# Patient Record
Sex: Female | Born: 1960 | Race: Black or African American | Hispanic: No | Marital: Single | Smoking: Never smoker
Health system: Southern US, Community
[De-identification: ages and names within clinical notes are randomized; demographics above are authoritative.]

## PROBLEM LIST (undated history)

## (undated) DIAGNOSIS — E079 Disorder of thyroid, unspecified: Secondary | ICD-10-CM

## (undated) HISTORY — PX: COLONOSCOPY: SHX174

## (undated) HISTORY — PX: OTHER SURGICAL HISTORY: SHX169

## (undated) HISTORY — PX: PITUITARY SURGERY: SHX203

## (undated) HISTORY — DX: Disorder of thyroid, unspecified: E07.9

---

## 2006-11-14 ENCOUNTER — Ambulatory Visit: Payer: Self-pay

## 2007-09-14 ENCOUNTER — Emergency Department: Payer: Self-pay | Admitting: Emergency Medicine

## 2009-02-03 ENCOUNTER — Ambulatory Visit: Payer: Self-pay | Admitting: Nurse Practitioner

## 2009-02-17 ENCOUNTER — Ambulatory Visit: Payer: Self-pay | Admitting: Family Medicine

## 2009-04-09 ENCOUNTER — Encounter (INDEPENDENT_AMBULATORY_CARE_PROVIDER_SITE_OTHER): Payer: Self-pay | Admitting: Neurosurgery

## 2009-04-09 ENCOUNTER — Inpatient Hospital Stay (HOSPITAL_COMMUNITY): Admission: RE | Admit: 2009-04-09 | Discharge: 2009-04-13 | Payer: Self-pay | Admitting: Neurosurgery

## 2009-09-11 ENCOUNTER — Ambulatory Visit: Payer: Self-pay | Admitting: Neurosurgery

## 2010-02-16 ENCOUNTER — Ambulatory Visit: Payer: Self-pay | Admitting: Nurse Practitioner

## 2010-08-26 LAB — SODIUM: Sodium: 143 mEq/L (ref 135–145)

## 2010-08-26 LAB — URINALYSIS, ROUTINE W REFLEX MICROSCOPIC
Bilirubin Urine: NEGATIVE
Hgb urine dipstick: NEGATIVE
Ketones, ur: NEGATIVE mg/dL
Protein, ur: NEGATIVE mg/dL
Urobilinogen, UA: 1 mg/dL (ref 0.0–1.0)

## 2010-08-26 LAB — TYPE AND SCREEN: Antibody Screen: NEGATIVE

## 2010-08-26 LAB — POTASSIUM
Potassium: 3.4 mEq/L — ABNORMAL LOW (ref 3.5–5.1)
Potassium: 3.8 mEq/L (ref 3.5–5.1)

## 2010-08-26 LAB — CBC
Hemoglobin: 12.9 g/dL (ref 12.0–15.0)
MCHC: 33.5 g/dL (ref 30.0–36.0)
MCV: 82.8 fL (ref 78.0–100.0)
RDW: 13.1 % (ref 11.5–15.5)

## 2010-11-13 ENCOUNTER — Ambulatory Visit: Payer: Self-pay | Admitting: Neurosurgery

## 2011-05-17 ENCOUNTER — Ambulatory Visit: Payer: Self-pay

## 2011-11-02 ENCOUNTER — Ambulatory Visit: Payer: Self-pay | Admitting: Family Medicine

## 2011-12-20 ENCOUNTER — Ambulatory Visit: Payer: Self-pay | Admitting: Neurosurgery

## 2012-06-21 ENCOUNTER — Ambulatory Visit: Payer: Self-pay

## 2012-06-23 LAB — BETA STREP CULTURE(ARMC)

## 2012-12-07 ENCOUNTER — Ambulatory Visit: Payer: Self-pay | Admitting: Family Medicine

## 2012-12-27 ENCOUNTER — Ambulatory Visit: Payer: Self-pay | Admitting: Family Medicine

## 2013-01-22 ENCOUNTER — Ambulatory Visit: Payer: Self-pay | Admitting: Family Medicine

## 2013-09-03 ENCOUNTER — Ambulatory Visit: Payer: Self-pay | Admitting: Physician Assistant

## 2013-09-03 LAB — URINALYSIS, COMPLETE
Bilirubin,UR: NEGATIVE
Glucose,UR: NEGATIVE mg/dL (ref 0–75)
KETONE: NEGATIVE
Leukocyte Esterase: NEGATIVE
NITRITE: NEGATIVE
Ph: 7 (ref 4.5–8.0)
Specific Gravity: 1.02 (ref 1.003–1.030)
WBC UR: NONE SEEN /HPF (ref 0–5)

## 2013-09-03 LAB — PREGNANCY, URINE: Pregnancy Test, Urine: NEGATIVE m[IU]/mL

## 2014-04-10 ENCOUNTER — Ambulatory Visit: Payer: Self-pay | Admitting: Family Medicine

## 2015-05-22 DIAGNOSIS — E559 Vitamin D deficiency, unspecified: Secondary | ICD-10-CM | POA: Insufficient documentation

## 2015-05-22 DIAGNOSIS — M722 Plantar fascial fibromatosis: Secondary | ICD-10-CM | POA: Insufficient documentation

## 2015-05-22 LAB — HM HIV SCREENING LAB: HM HIV Screening: NEGATIVE

## 2015-05-22 LAB — HM HEPATITIS C SCREENING LAB: HM Hepatitis Screen: NEGATIVE

## 2015-05-27 ENCOUNTER — Other Ambulatory Visit: Payer: Self-pay | Admitting: Family Medicine

## 2015-05-27 DIAGNOSIS — Z1231 Encounter for screening mammogram for malignant neoplasm of breast: Secondary | ICD-10-CM

## 2015-06-23 LAB — HM PAP SMEAR: HM Pap smear: NEGATIVE

## 2015-06-24 ENCOUNTER — Ambulatory Visit
Admission: RE | Admit: 2015-06-24 | Discharge: 2015-06-24 | Disposition: A | Discharge status: Home / Self Care | Payer: Managed Care, Other (non HMO) | Source: Ambulatory Visit | Attending: Family Medicine | Admitting: Family Medicine

## 2015-06-24 DIAGNOSIS — Z1231 Encounter for screening mammogram for malignant neoplasm of breast: Secondary | ICD-10-CM | POA: Diagnosis present

## 2016-02-03 ENCOUNTER — Ambulatory Visit
Admission: EM | Admit: 2016-02-03 | Discharge: 2016-02-03 | Disposition: A | Discharge status: Home / Self Care | Payer: Managed Care, Other (non HMO) | Attending: Family Medicine | Admitting: Family Medicine

## 2016-02-03 DIAGNOSIS — H6121 Impacted cerumen, right ear: Secondary | ICD-10-CM | POA: Diagnosis not present

## 2016-02-03 NOTE — ED Triage Notes (Signed)
Patient states that she was taking swimming lessons yesterday and she got water in her right ear that will not drain out and she has multiple things to try to release the water without success.

## 2016-02-03 NOTE — ED Provider Notes (Signed)
CSN: [id number]     Arrival date & time 02/03/16  1944 History   First MD Initiated Contact with Patient 02/03/16 2009     Chief Complaint  Patient presents with  . Ear Fullness   (Consider location/radiation/quality/duration/timing/severity/associated sxs/prior Treatment) HPI  This 55 year old female who presents with right ear fullness. She states that she started taking swimming lessons at the [facility] in [place] and after getting out of the water yesterday felt that she had on her trapped in her right ear. She's tried numerous over-the-counter preparations today in an attempt to remove the water but has not been successful. No pain in her ear just a fullness and decreased hearing     History reviewed. No pertinent past medical history. Past Surgical History:  Procedure Laterality Date  . PITUITARY SURGERY     Family History  Problem Relation Age of Onset  . Breast cancer Sister 34  . CVA Father    Social History  Substance Use Topics  . Smoking status: Never Smoker  . Smokeless tobacco: Never Used  . Alcohol use Yes     Comment: occasionally   OB History    No data available     Review of Systems  Constitutional: Negative for activity change, chills, fatigue and fever.  HENT: Positive for ear discharge.   All other systems reviewed and are negative.   Allergies  Review of patient's allergies indicates no known allergies.  Home Medications   Prior to Admission medications   Medication Sig Start Date End Date Taking? Authorizing Provider  cholecalciferol (VITAMIN D) 400 units TABS tablet Take 400 Units by mouth.   Yes Historical Provider, MD  hydrocortisone (CORTEF) 10 MG tablet Take 10 mg by mouth daily.   Yes Historical Provider, MD  levothyroxine (SYNTHROID, LEVOTHROID) 88 MCG tablet Take 88 mcg by mouth daily before breakfast.   Yes Historical Provider, MD   Meds Ordered and Administered this Visit  Medications - No data to display  BP (!) 117/59 (BP  Location: Left Arm)   Pulse 77   Temp 98.2 F (36.8 C) (Oral)   Resp 16   Ht 5\' 2"  (1.575 m)   Wt 184 lb (83.5 kg)   SpO2 100%   BMI 33.65 kg/m  No data found.   Physical Exam  Constitutional: She is oriented to person, place, and time. She appears well-developed and well-nourished. No distress.  HENT:  Head: Normocephalic and atraumatic.  Left ear is normal. 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 There is no pain with movement of the tragus.  Eyes: EOM are normal. Pupils are equal, round, and reactive to light.  Neck: Normal range of motion. Neck supple.  Musculoskeletal: Normal range of motion.  Neurological: She is alert and oriented to person, place, and time.  Skin: Skin is warm and dry. She is not diaphoretic.  Psychiatric: She has a normal mood and affect. Her behavior is normal. Judgment and thought content normal.  Nursing note and vitals reviewed.   Urgent Care Course   Clinical Course    Procedures (including critical care time)  Labs Review Labs Reviewed - No data to display  Imaging Review No results found.   Visual Acuity Review  Right Eye Distance:   Left Eye Distance:   Bilateral Distance:    Right Eye Near:   Left Eye Near:    Bilateral Near:     Right ear lavage was performed  with good success. She has a small amount of residual cerumen. I have told her that once a week she should use a saturated cotton ball rolling in her ear for 10 minutes and this may help decrease the amount of cerumen buildup that she has    MDM   1. Cerumen impaction, right    Isn't felt much better and was able to now herar and  does not have the ear fullness when she arrived. Your use of mineral oil weekly to help prevent cerumen buildup.    Lorin Picket, PA-C 02/03/16 2030

## 2016-06-25 ENCOUNTER — Other Ambulatory Visit: Payer: Self-pay | Admitting: Internal Medicine

## 2016-06-25 DIAGNOSIS — E038 Other specified hypothyroidism: Secondary | ICD-10-CM

## 2016-06-25 DIAGNOSIS — Z86018 Personal history of other benign neoplasm: Secondary | ICD-10-CM

## 2016-06-25 DIAGNOSIS — Z8639 Personal history of other endocrine, nutritional and metabolic disease: Principal | ICD-10-CM

## 2016-06-25 DIAGNOSIS — E2749 Other adrenocortical insufficiency: Secondary | ICD-10-CM

## 2016-07-22 ENCOUNTER — Ambulatory Visit
Admission: RE | Admit: 2016-07-22 | Discharge: 2016-07-22 | Disposition: A | Discharge status: Home / Self Care | Payer: Managed Care, Other (non HMO) | Source: Ambulatory Visit | Attending: Internal Medicine | Admitting: Internal Medicine

## 2016-07-22 ENCOUNTER — Encounter: Payer: Self-pay | Admitting: Radiology

## 2016-07-22 DIAGNOSIS — E038 Other specified hypothyroidism: Secondary | ICD-10-CM | POA: Diagnosis not present

## 2016-07-22 DIAGNOSIS — E2749 Other adrenocortical insufficiency: Secondary | ICD-10-CM | POA: Diagnosis not present

## 2016-07-22 DIAGNOSIS — Z8639 Personal history of other endocrine, nutritional and metabolic disease: Secondary | ICD-10-CM | POA: Insufficient documentation

## 2016-07-22 DIAGNOSIS — Z86018 Personal history of other benign neoplasm: Secondary | ICD-10-CM

## 2016-07-22 MED ORDER — GADOBENATE DIMEGLUMINE 529 MG/ML IV SOLN
10.0000 mL | Freq: Once | INTRAVENOUS | Status: AC | PRN
  Administered 2016-07-22: 9 mL via INTRAVENOUS

## 2016-08-12 ENCOUNTER — Other Ambulatory Visit: Payer: Self-pay | Admitting: Family Medicine

## 2016-08-12 DIAGNOSIS — Z1231 Encounter for screening mammogram for malignant neoplasm of breast: Secondary | ICD-10-CM

## 2016-09-06 ENCOUNTER — Ambulatory Visit
Admission: RE | Admit: 2016-09-06 | Discharge: 2016-09-06 | Disposition: A | Discharge status: Home / Self Care | Payer: Managed Care, Other (non HMO) | Source: Ambulatory Visit | Attending: Family Medicine | Admitting: Family Medicine

## 2016-09-06 DIAGNOSIS — Z1231 Encounter for screening mammogram for malignant neoplasm of breast: Secondary | ICD-10-CM

## 2017-10-18 ENCOUNTER — Other Ambulatory Visit: Payer: Self-pay | Admitting: Family Medicine

## 2017-10-18 DIAGNOSIS — Z1231 Encounter for screening mammogram for malignant neoplasm of breast: Secondary | ICD-10-CM

## 2017-11-14 ENCOUNTER — Ambulatory Visit
Admission: RE | Admit: 2017-11-14 | Discharge: 2017-11-14 | Disposition: A | Discharge status: Home / Self Care | Payer: Managed Care, Other (non HMO) | Source: Ambulatory Visit | Attending: Family Medicine | Admitting: Family Medicine

## 2017-11-14 DIAGNOSIS — Z1231 Encounter for screening mammogram for malignant neoplasm of breast: Secondary | ICD-10-CM | POA: Diagnosis present

## 2018-06-11 IMAGING — MR MR HEAD WO/W CM
13 of 18 series · 39 of 48 positions shown · IV contrast (multihance)
Comparison: Previous brain MR 12/20/2011.

CLINICAL DATA: Pituitary tumor removed 2818. Continued
surveillance.

EXAM:
MRI HEAD WITHOUT AND WITH CONTRAST
TECHNIQUE: Multiplanar, multiecho pulse sequences of the brain and surrounding
structures were obtained without and with intravenous contrast.
CONTRAST:  9mL MULTIHANCE GADOBENATE DIMEGLUMINE 529 MG/ML IV SOLN

[Series 2: T1 · sagittal · 5.0mm · 0.45mm/px · 4 of 25 slices shown (1 of 5)]
[im 1/25]
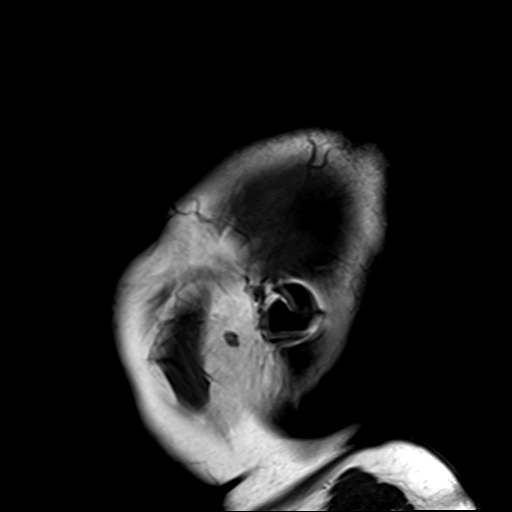
[im 9/25]
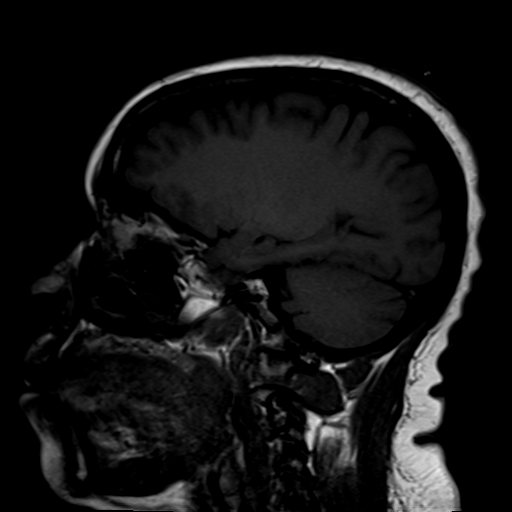
[im 17/25]
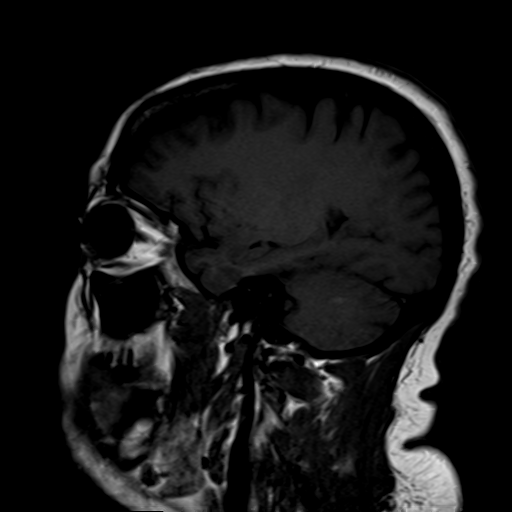
[im 25/25]
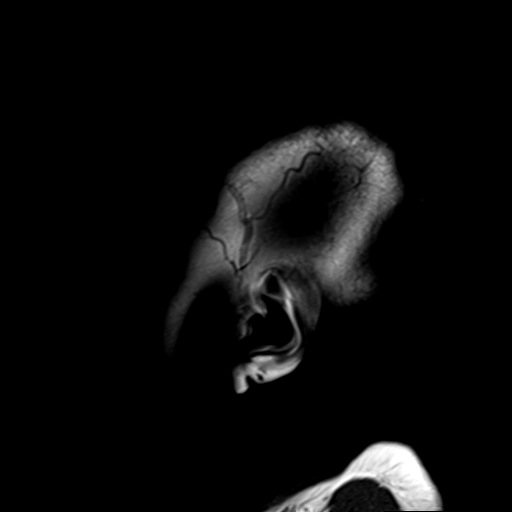

[Series 4: DWI · axial · 3.0mm · 1.80mm/px · z∈[-61,+96]mm · 6 of 42 slices shown]
[im 1/42]
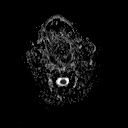
[im 9/42]
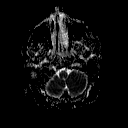
[im 17/42]
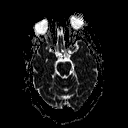
[im 25/42]
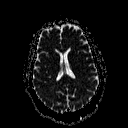
[im 33/42]
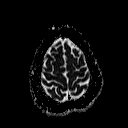
[im 42/42]
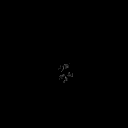

[Series 5: T2 · axial · 5.0mm · 0.60mm/px · z∈[-59,+94]mm · 3 of 25 slices shown (1 of 2)]
[im 1/25]
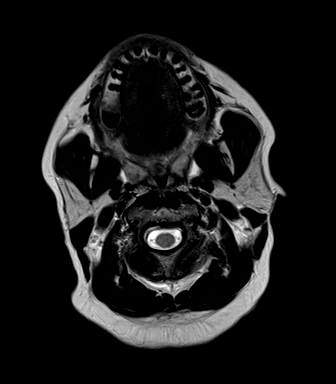
[im 13/25]
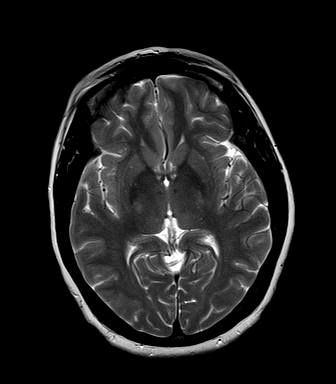
[im 25/25]
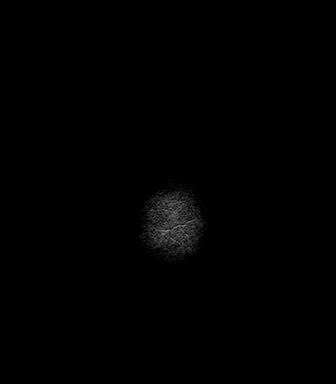

[Series 6: FLAIR · axial · 5.0mm · 0.45mm/px · z∈[-59,+94]mm · 3 of 25 slices shown]
[im 1/25]
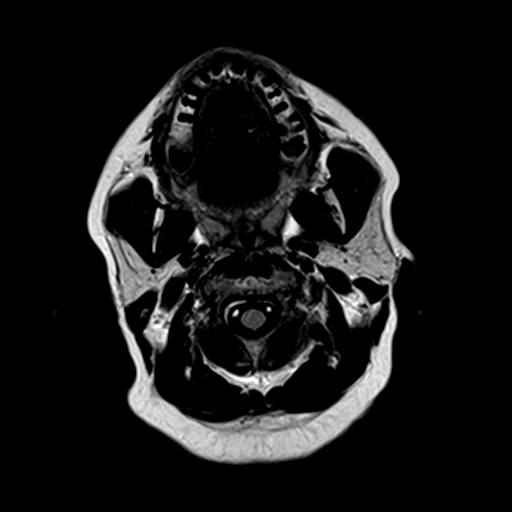
[im 13/25]
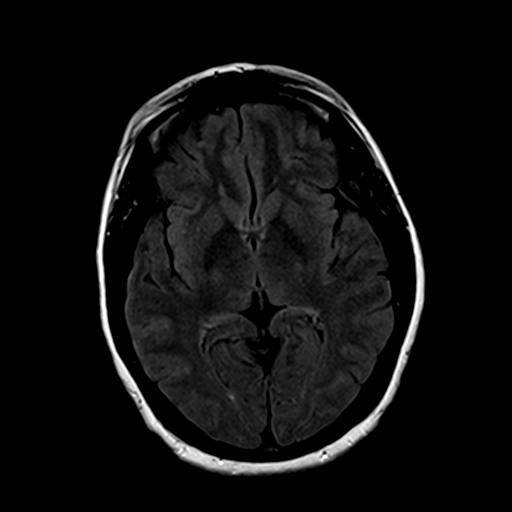
[im 25/25]
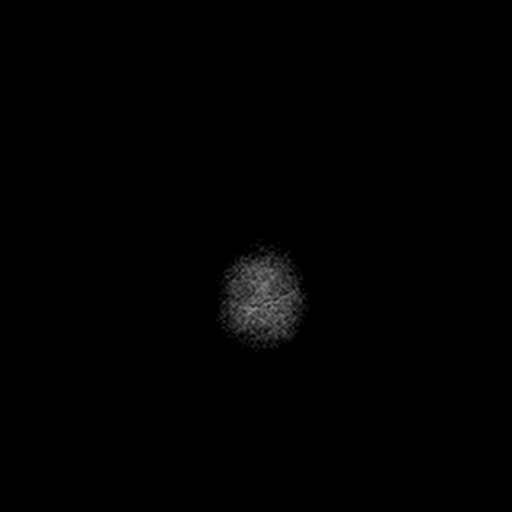

[Series 7: T2 · axial · 5.0mm · 0.45mm/px · z∈[-59,+94]mm · 3 of 25 slices shown (2 of 2)]
[im 1/25]
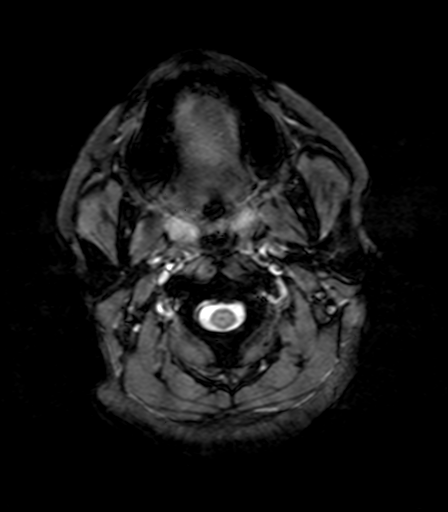
[im 13/25]
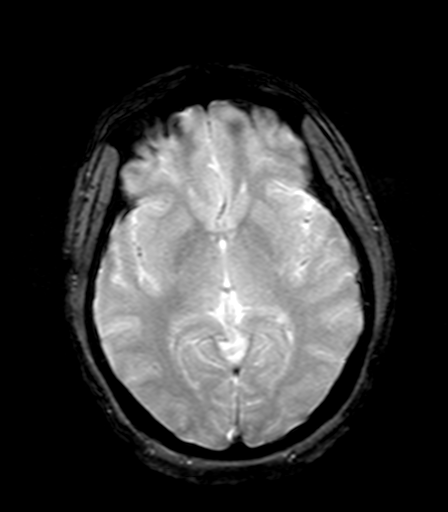
[im 25/25]
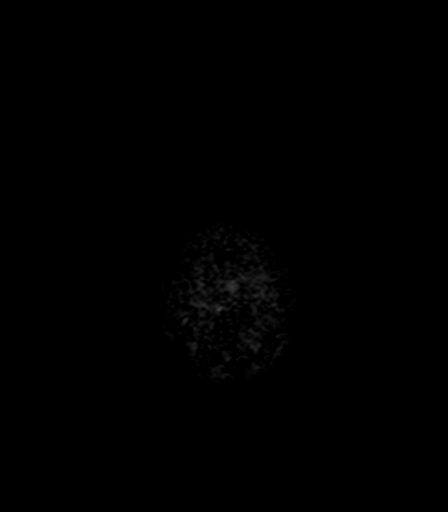

[Series 8: T1 · sagittal · 3.0mm · 0.42mm/px · 2 of 15 slices shown (2 of 5)]
[im 1/15]
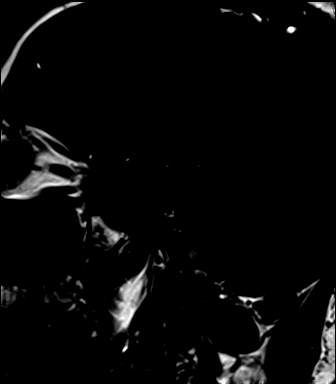
[im 15/15]
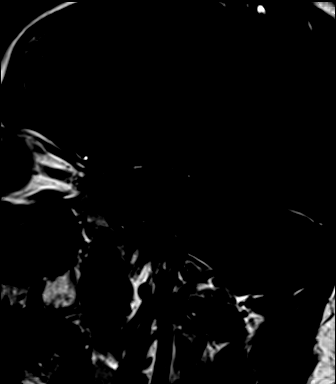

[Series 9: T1 · coronal · 3.0mm · 0.83mm/px · 2 of 15 slices shown (3 of 5)]
[im 1/15]
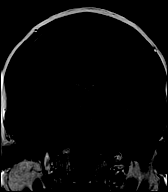
[im 15/15]
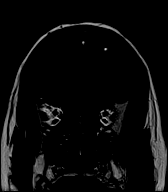

[Series 10: t1_tse_cor_dynamic pit · coronal · 3.0mm · 0.42mm/px · 1 of 11 slices shown]
[im 1/11]
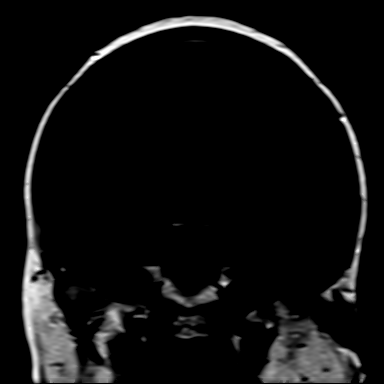

[Series 11: ti cor dynamic · coronal · 3.0mm · 0.42mm/px · 1 of 11 slices shown]
[im 1/11]
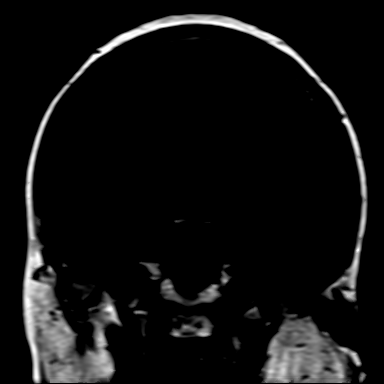

[Series 16: T1 · sagittal · 3.0mm · 0.42mm/px · 2 of 15 slices shown (4 of 5)]
[im 1/15]
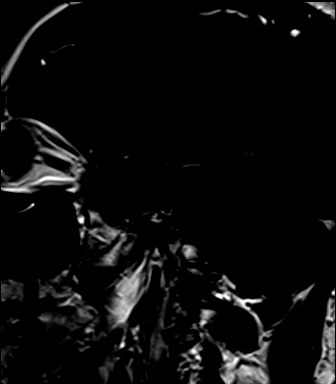
[im 15/15]
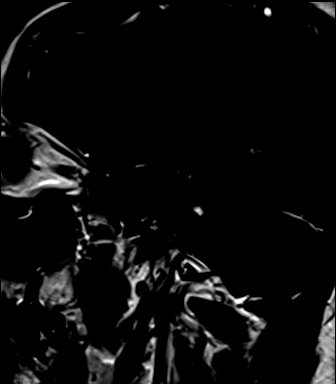

[Series 17: T1 · coronal · 3.0mm · 0.83mm/px · 2 of 15 slices shown (5 of 5)]
[im 1/15]
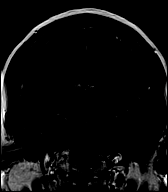
[im 15/15]
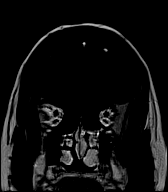

[Series 18: T1 post-contrast · axial · 3.0mm · 1.00mm/px · z∈[-61,+101]mm · 7 of 56 slices shown (1 of 2)]
[im 1/56]
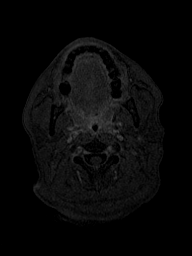
[im 10/56]
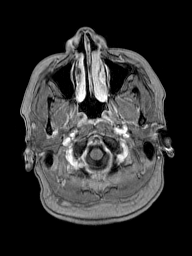
[im 19/56]
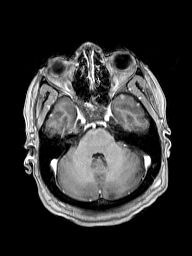
[im 28/56]
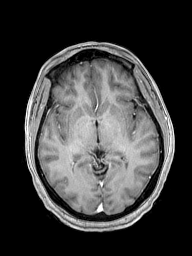
[im 37/56]
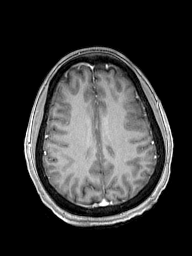
[im 46/56]
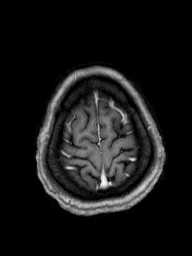
[im 56/56]
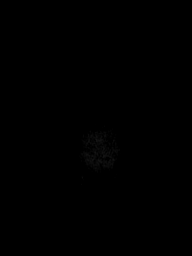

[Series 19: T1 post-contrast · coronal · 5.0mm · 0.43mm/px · 3 of 27 slices shown (2 of 2)]
[im 1/27]
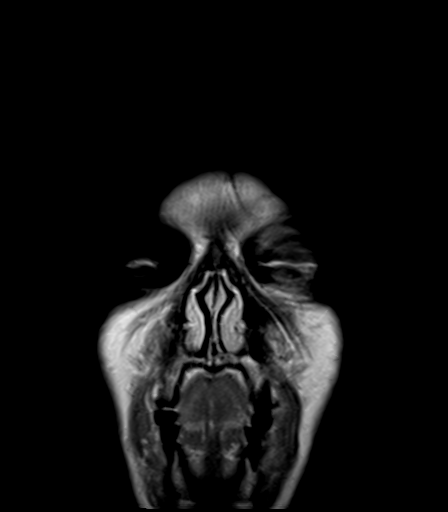
[im 14/27]
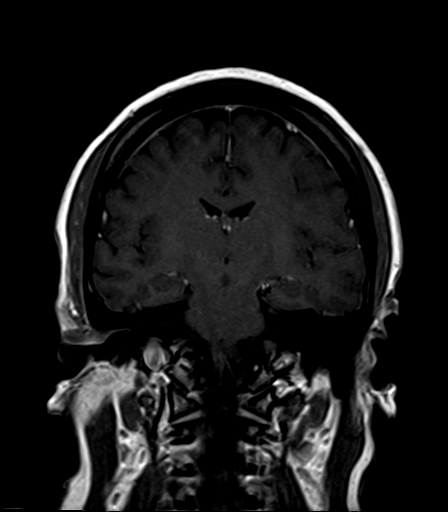
[im 27/27]
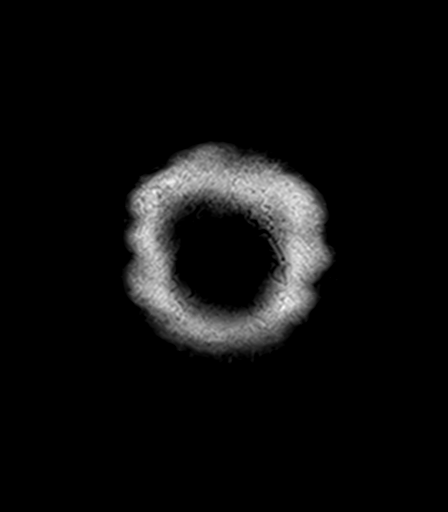

[39 of 48 positions shown; findings below may reference images not displayed]

FINDINGS: Brain: No evidence for acute infarction, hemorrhage, mass lesion,
hydrocephalus, or extra-axial fluid. Normal cerebral volume. No
significant white matter disease.

Vascular: Thin-section imaging through the sella turcica of was
performed before, during, and after contrast administration. Gland
is atrophic. Stalk midline. Trans-sphenoidal approach to the
pituitary, with packing material, including fat, appears
uncomplicated. Parasellar and suprasellar regions unremarkable.

Post infusion imaging through the entire head demonstrates no
abnormal enhancement of the brain or meninges.

Skull and upper cervical spine: Normal marrow signal.

Sinuses/Orbits:  Negative.

Other: None.

Compared with priors, no evidence for tumor regrowth.
IMPRESSION: Unremarkable appearing brain MR status post transsphenoidal
hypophysectomy.

No evidence for recurrent tumor.  No acute intracranial findings.

## 2018-06-26 LAB — LIPID PANEL
Cholesterol: 186 (ref 0–200)
HDL: 45 (ref 35–70)
LDL Cholesterol: 128
Triglycerides: 67 (ref 40–160)

## 2018-06-26 LAB — HEMOGLOBIN A1C: Hemoglobin A1C: 5.7

## 2018-11-16 LAB — BASIC METABOLIC PANEL
BUN: 12 (ref 4–21)
CO2: 24 — AB (ref 13–22)
Chloride: 107 (ref 99–108)
Creatinine: 1 (ref 0.5–1.1)
Glucose: 85
Potassium: 3.6 (ref 3.4–5.3)
Sodium: 140 (ref 137–147)

## 2018-11-20 LAB — HM DEXA SCAN: HM Dexa Scan: NORMAL

## 2019-01-23 LAB — HM MAMMOGRAPHY

## 2019-08-07 ENCOUNTER — Encounter: Payer: Self-pay | Admitting: Family Medicine

## 2019-08-13 ENCOUNTER — Encounter: Payer: Self-pay | Admitting: Family Medicine

## 2019-08-13 ENCOUNTER — Other Ambulatory Visit: Payer: Self-pay

## 2019-08-13 ENCOUNTER — Ambulatory Visit: Payer: Managed Care, Other (non HMO) | Admitting: Family Medicine

## 2019-08-13 VITALS — BP 104/78 | HR 76 | Temp 97.2°F | Ht 62.5 in | Wt 186.5 lb

## 2019-08-13 DIAGNOSIS — L309 Dermatitis, unspecified: Secondary | ICD-10-CM | POA: Insufficient documentation

## 2019-08-13 DIAGNOSIS — D352 Benign neoplasm of pituitary gland: Secondary | ICD-10-CM | POA: Insufficient documentation

## 2019-08-13 DIAGNOSIS — E2740 Unspecified adrenocortical insufficiency: Secondary | ICD-10-CM

## 2019-08-13 DIAGNOSIS — G8929 Other chronic pain: Secondary | ICD-10-CM

## 2019-08-13 DIAGNOSIS — M545 Low back pain, unspecified: Secondary | ICD-10-CM

## 2019-08-13 DIAGNOSIS — E038 Other specified hypothyroidism: Secondary | ICD-10-CM

## 2019-08-13 DIAGNOSIS — E785 Hyperlipidemia, unspecified: Secondary | ICD-10-CM | POA: Insufficient documentation

## 2019-08-13 MED ORDER — CYCLOBENZAPRINE HCL 5 MG PO TABS: ORAL_TABLET | ORAL | 1 refills | Status: DC

## 2019-08-13 MED ORDER — BETAMETHASONE DIPROPIONATE 0.05 % EX CREA: 1.0000 "application " | TOPICAL_CREAM | Freq: Two times a day (BID) | CUTANEOUS | 0 refills | Status: DC

## 2019-08-13 NOTE — Assessment & Plan Note (Signed)
Sees endocrine through [facility]. Following surgery. Stable on levothyroxine.

## 2019-08-13 NOTE — Patient Instructions (Addendum)
Great to meet you!  It is safe and a good idea to get your Covid Vaccine  Keep using the cream on your hands   Weight loss is 95% diet and 5% exercise

## 2019-08-13 NOTE — Progress Notes (Signed)
   Subjective:     Alexis Wyatt is a 59 y.o. female presenting for Establish Care (previous PCP Dr Vickki Muff), Discuss COVID vaccine, and Rash (on hands. off and on throught out the year.)     HPI   #Rash on hands - on wrist, hands, fingers - notices it around the watch - tries to drink water/sparking water - notices the rash happens more when she drinks juice - also happens when she drinks soda - improves with betamethasone - will use lotion on her hands  #Covid vaccine - safety of vaccine  #weight loss - currently on weight watchers - before covid was 192 lbs - was getting on the treadmill 3 times a week - lost 15 lbs and the gym closes  Review of Systems   Social History   Tobacco Use  Smoking Status Never Smoker  Smokeless Tobacco Never Used        Objective:    BP Readings from Last 3 Encounters:  08/13/19 104/78  02/03/16 (!) 117/59   Wt Readings from Last 3 Encounters:  08/13/19 186 lb 8 oz (84.6 kg)  02/03/16 184 lb (83.5 kg)    BP 104/78   Pulse 76   Temp (!) 97.2 F (36.2 C)   Ht 5' 2.5" (1.588 m)   Wt 186 lb 8 oz (84.6 kg)   LMP 04/23/2015 Comment: premenopausal, not preg  BMI 33.57 kg/m    Physical Exam Constitutional:      General: She is not in acute distress.    Appearance: She is well-developed. She is not diaphoretic.  HENT:     Right Ear: External ear normal.     Left Ear: External ear normal.     Nose: Nose normal.  Eyes:     Conjunctiva/sclera: Conjunctivae normal.  Cardiovascular:     Rate and Rhythm: Normal rate.  Pulmonary:     Effort: Pulmonary effort is normal.  Musculoskeletal:     Cervical back: Neck supple.  Skin:    General: Skin is warm and dry.     Capillary Refill: Capillary refill takes less than 2 seconds.     Comments: Bilateral hands with scaly rash on the wrists, fingers, and palms  Neurological:     Mental Status: She is alert. Mental status is at baseline.  Psychiatric:        Mood and  Affect: Mood normal.        Behavior: Behavior normal.           Assessment & Plan:   Problem List Items Addressed This Visit      Endocrine   Acquired central hypothyroidism    Sees endocrine through [facility]. Following surgery. Stable on levothyroxine.       Pituitary adenoma ([place]) - Primary    S/p surgery with adrenal insufficiency on cortef. Follows with endocrinology      Adrenal insufficiency ([facility])     Musculoskeletal and Integument   Eczema of both hands    Continue daily moisturizer and prn betamethasone.         Other   Chronic low back pain    Takes flexeril prn to help with pain and sleep      Relevant Medications   cyclobenzaprine (FLEXERIL) 5 MG tablet       Return in about 1 year (around 08/12/2020).  Lesleigh Noe, MD

## 2019-08-13 NOTE — Assessment & Plan Note (Signed)
Takes flexeril prn to help with pain and sleep

## 2019-08-13 NOTE — Assessment & Plan Note (Signed)
Continue daily moisturizer and prn betamethasone.

## 2019-08-13 NOTE — Assessment & Plan Note (Signed)
S/p surgery with adrenal insufficiency on cortef. Follows with endocrinology

## 2019-09-18 ENCOUNTER — Encounter: Payer: Self-pay | Admitting: Family Medicine

## 2019-09-18 MED ORDER — HYDROCORTISONE 10 MG PO TABS: 10.0000 mg | ORAL_TABLET | Freq: Two times a day (BID) | ORAL | 1 refills | Status: DC

## 2019-09-18 MED ORDER — ERGOCALCIFEROL 1.25 MG (50000 UT) PO CAPS: ORAL_CAPSULE | ORAL | 0 refills | Status: DC

## 2019-09-18 NOTE — Telephone Encounter (Signed)
Patient established care with Dr Einar Pheasant on 08/13/19. Pulled down both medications for review

## 2019-12-04 ENCOUNTER — Other Ambulatory Visit: Payer: Self-pay | Admitting: Family Medicine

## 2019-12-05 NOTE — Telephone Encounter (Signed)
Last OV: 08/13/19 Last refill: 09/18/19 Last Vitamin D level: ??

## 2020-01-15 LAB — HM MAMMOGRAPHY

## 2020-01-18 ENCOUNTER — Other Ambulatory Visit: Payer: Self-pay | Admitting: Family Medicine

## 2020-03-10 ENCOUNTER — Other Ambulatory Visit: Payer: Self-pay | Admitting: Family Medicine

## 2020-05-22 ENCOUNTER — Encounter: Payer: Self-pay | Admitting: Family Medicine

## 2020-05-29 NOTE — Telephone Encounter (Signed)
Agree with need for appointment. Would be Ok with in person as long as her covid test is negative and no new symptoms.

## 2020-05-29 NOTE — Telephone Encounter (Signed)
[facility] Day - Client TELEPHONE ADVICE RECORD [facility] Patient Name: Alexis Wyatt Gender: Female DOB: Jul 31, 1960 Age: 60 Y 5 M 29 D Return Phone Number: [phone] (Primary) Address: City/State/Zip: [place] [address] Client [facility] Day - Client Client Site [facility] - Day Physician Gweneth Dimitri- MD Contact Type Call Who Is Calling Patient / Member / Family / Caregiver Call Type Triage / Clinical Relationship To Patient Self Return Phone Number [phone] (Primary) Chief Complaint Neck Pain Reason for Call Symptomatic / Request for Health Information Initial Comment Caller states, swollen left gland by her neck. Waiting for Covid results. Translation No Nurse Assessment Nurse: Rinaldo Ratel, RN, [place] Date/Time Lamount Cohen Time): 05/28/2020 6:50:28 PM Confirm and document reason for call. If symptomatic, describe symptoms. ---Caller states she has swelling under her jaw area- states it is like a gland or lymph node. Caller denies any other sxs. Caller is fully covid vaccinated, but had test performed yesterday due to poss exposure to covid. swelling started a couple few days ago. Does the patient have any new or worsening symptoms? ---Yes Will a triage be completed? ---Yes Related visit to physician within the last 2 weeks? ---No Does the PT have any chronic conditions? (i.e. diabetes, asthma, this includes High risk factors for pregnancy, etc.) ---Yes List chronic conditions. ---pituitary gland removed- adrenal insufficiency Is this a behavioral health or substance abuse call? ---No Guidelines Guideline Title Affirmed Question Affirmed Notes Nurse Date/Time Lamount Cohen Time) Lymph Nodes - Swollen [1] Mildly swollen lymph node AND [2] has cold symptoms (e.g., runny nose, cough, sore throat) Rinaldo Ratel, RN, [place] 05/28/2020 6:54:51 PM Disp. Time Lamount Cohen Time) Disposition Final User 05/28/2020 6:58:25  PM Home Care Yes Rinaldo Ratel, RN, [place] Caller Disagree/Comply Comply PLEASE NOTE: All timestamps contained within this report are represented as [place] Standard Time. CONFIDENTIALTY NOTICE: This fax transmission is intended only for the addressee. It contains information that is legally privileged, confidential or otherwise protected from use or disclosure. If you are not the intended recipient, you are strictly prohibited from reviewing, disclosing, copying using or disseminating any of this information or taking any action in reliance on or regarding this information. If you have received this fax in error, please notify us immediately by telephone so that we can arrange for its return to [place]. Phone: [phone], Toll-Free: [phone], Fax: [phone] Page: 2 of 2 Call Id: [id number] Caller Understands Yes PreDisposition Call Doctor Care Advice Given Per Guideline * You should be able to treat this at home. REASSURANCE AND EDUCATION - SWOLLEN NODES FROM A VIRAL INFECTION: * Viral throat infections and colds can cause lymph nodes in the neck to double in size. * Swollen lymph nodes are not contagious. CONTAGIOUSNESS: CALL BACK IF: * Node enlarges to over 1 inch (25 mm) in size * You become worse * A node larger than 1/2 inch (1 cm) persists over 2 weeks HOME CARE:

## 2020-05-29 NOTE — Telephone Encounter (Signed)
Sending note to DR Selena Batten and [place] Mole at front desk to get pt an appt.

## 2020-05-30 NOTE — Telephone Encounter (Signed)
Called patient to schedule follow up. Scheduled 06/03/20. Will call once test results come in to change if needed.

## 2020-06-03 ENCOUNTER — Encounter: Payer: Self-pay | Admitting: Family Medicine

## 2020-06-03 ENCOUNTER — Other Ambulatory Visit: Payer: Self-pay

## 2020-06-03 ENCOUNTER — Ambulatory Visit: Payer: Managed Care, Other (non HMO) | Admitting: Family Medicine

## 2020-06-03 VITALS — BP 108/70 | HR 64 | Temp 97.2°F | Wt 191.5 lb

## 2020-06-03 DIAGNOSIS — E782 Mixed hyperlipidemia: Secondary | ICD-10-CM

## 2020-06-03 DIAGNOSIS — R221 Localized swelling, mass and lump, neck: Secondary | ICD-10-CM | POA: Insufficient documentation

## 2020-06-03 DIAGNOSIS — E038 Other specified hypothyroidism: Secondary | ICD-10-CM

## 2020-06-03 DIAGNOSIS — E2740 Unspecified adrenocortical insufficiency: Secondary | ICD-10-CM

## 2020-06-03 DIAGNOSIS — R7303 Prediabetes: Secondary | ICD-10-CM | POA: Insufficient documentation

## 2020-06-03 LAB — COMPREHENSIVE METABOLIC PANEL
ALT: 25 U/L (ref 0–35)
AST: 13 U/L (ref 0–37)
Albumin: 4 g/dL (ref 3.5–5.2)
Alkaline Phosphatase: 71 U/L (ref 39–117)
BUN: 14 mg/dL (ref 6–23)
CO2: 26 mEq/L (ref 19–32)
Calcium: 9.2 mg/dL (ref 8.4–10.5)
Chloride: 105 mEq/L (ref 96–112)
Creatinine, Ser: 0.81 mg/dL (ref 0.40–1.20)
GFR: 79.96 mL/min (ref >60.00)
Glucose, Bld: 86 mg/dL (ref 70–99)
Potassium: 3.9 mEq/L (ref 3.5–5.1)
Sodium: 137 mEq/L (ref 135–145)
Total Bilirubin: 0.6 mg/dL (ref 0.2–1.2)
Total Protein: 6.9 g/dL (ref 6.0–8.3)

## 2020-06-03 LAB — CBC
HCT: 40.1 % (ref 36.0–46.0)
Hemoglobin: 13.1 g/dL (ref 12.0–15.0)
MCHC: 32.7 g/dL (ref 30.0–36.0)
MCV: 81.3 fl (ref 78.0–100.0)
Platelets: 355 10*3/uL (ref 150.0–400.0)
RBC: 4.94 Mil/uL (ref 3.87–5.11)
RDW: 13.8 % (ref 11.5–15.5)
WBC: 7.4 10*3/uL (ref 4.0–10.5)

## 2020-06-03 LAB — LIPID PANEL
Cholesterol: 216 mg/dL — ABNORMAL HIGH (ref 0–200)
HDL: 41.1 mg/dL (ref >39.00)
LDL Cholesterol: 153 mg/dL — ABNORMAL HIGH (ref 0–99)
NonHDL: 174.92
Total CHOL/HDL Ratio: 5
Triglycerides: 108 mg/dL (ref 0.0–149.0)
VLDL: 21.6 mg/dL (ref 0.0–40.0)

## 2020-06-03 LAB — TSH: TSH: 0.05 u[IU]/mL — ABNORMAL LOW (ref 0.35–4.50)

## 2020-06-03 LAB — HEMOGLOBIN A1C: Hgb A1c MFr Bld: 5.9 % (ref 4.6–6.5)

## 2020-06-03 NOTE — Progress Notes (Signed)
Subjective:     Alexis Wyatt is a 60 y.o. female presenting for Neck Pain (Swollen Along the L side x 2 weeks )     HPI  #neck swelling - woke up on 05/22/2020 - left side puffy - tried rubbing alcohol - no pain just swelling - then started noticing that the puffiness but it has improved - tested for covid due to exposure to covid - tested negative - no sinus drainage or cough -   Review of Systems  Constitutional: Negative for chills and fever.  Cardiovascular: Negative for palpitations.  Endocrine: Negative for cold intolerance and heat intolerance.  Musculoskeletal: Negative for neck pain and neck stiffness.  Skin: Negative for color change and rash.     Social History   Tobacco Use  Smoking Status Never Smoker  Smokeless Tobacco Never Used        Objective:    BP Readings from Last 3 Encounters:  06/03/20 108/70  08/13/19 104/78  02/03/16 (!) 117/59   Wt Readings from Last 3 Encounters:  06/03/20 191 lb 8 oz (86.9 kg)  08/13/19 186 lb 8 oz (84.6 kg)  02/03/16 184 lb (83.5 kg)    BP 108/70   Pulse 64   Temp (!) 97.2 F (36.2 C) (Temporal)   Wt 191 lb 8 oz (86.9 kg)   LMP 04/23/2015 Comment: premenopausal, not preg  SpO2 98%   BMI 34.47 kg/m    Physical Exam Constitutional:      General: She is not in acute distress.    Appearance: She is well-developed. She is not diaphoretic.  HENT:     Right Ear: External ear normal.     Left Ear: External ear normal.  Eyes:     Conjunctiva/sclera: Conjunctivae normal.  Neck:     Thyroid: No thyroid mass, thyromegaly or thyroid tenderness.     Comments: Slight fullness on the left compared to the right neck w/o any discrete mass or lesion Cardiovascular:     Rate and Rhythm: Normal rate.  Pulmonary:     Effort: Pulmonary effort is normal.  Musculoskeletal:     Cervical back: Normal range of motion and neck supple. No rigidity or tenderness. No pain with movement.  Lymphadenopathy:      Cervical: No cervical adenopathy.  Skin:    General: Skin is warm and dry.     Capillary Refill: Capillary refill takes less than 2 seconds.  Neurological:     Mental Status: She is alert. Mental status is at baseline.  Psychiatric:        Mood and Affect: Mood normal.        Behavior: Behavior normal.           Assessment & Plan:   Problem List Items Addressed This Visit      Endocrine   Acquired central hypothyroidism - Primary    Low suspicion that thyroid is related to neck swelling but will recheck. Cont levothyroxine 88 mcg      Relevant Orders   TSH   CBC   Adrenal insufficiency ([place])    Overdue for endocrine follow-up. Cont hydrocortisone. She will look into [facility] endo providers as she would like to find someone closer to home and get back to me with where she would like to be referred.         Other   Hyperlipidemia   Relevant Orders   Comprehensive metabolic panel   Lipid panel   CBC   Prediabetes  Will recheck. Continue healthy diet/exercise      Relevant Orders   Comprehensive metabolic panel   Hemoglobin A1c   Neck swelling    No mass. As improving and no pain advised watching and waiting. Labs today to evaluate further.       Relevant Orders   CBC     Return in about 6 months (around 12/01/2020) for annual exam.  Lesleigh Noe, MD  This visit occurred during the SARS-CoV-2 public health emergency.  Safety protocols were in place, including screening questions prior to the visit, additional usage of staff PPE, and extensive cleaning of exam room while observing appropriate contact time as indicated for disinfecting solutions.

## 2020-06-03 NOTE — Assessment & Plan Note (Signed)
Overdue for endocrine follow-up. Cont hydrocortisone. She will look into [facility] endo providers as she would like to find someone closer to home and get back to me with where she would like to be referred.

## 2020-06-03 NOTE — Patient Instructions (Addendum)
[email or web address]  [facility]  Would recommend you continue to watch the neck swelling  Call endocrinology for follow-up appointment  Mychart if you decide who you want to be referred to  Try to think back to who did the colonscopy - so we can request records

## 2020-06-03 NOTE — Assessment & Plan Note (Signed)
Will recheck. Continue healthy diet/exercise

## 2020-06-03 NOTE — Assessment & Plan Note (Signed)
No mass. As improving and no pain advised watching and waiting. Labs today to evaluate further.

## 2020-06-03 NOTE — Assessment & Plan Note (Signed)
Low suspicion that thyroid is related to neck swelling but will recheck. Cont levothyroxine 88 mcg

## 2020-06-04 ENCOUNTER — Other Ambulatory Visit: Payer: Self-pay | Admitting: Family Medicine

## 2020-06-04 DIAGNOSIS — E038 Other specified hypothyroidism: Secondary | ICD-10-CM

## 2020-06-04 MED ORDER — LEVOTHYROXINE SODIUM 75 MCG PO TABS: 75.0000 ug | ORAL_TABLET | Freq: Every day | ORAL | 3 refills | Status: DC

## 2020-06-06 ENCOUNTER — Other Ambulatory Visit: Payer: Self-pay | Admitting: Family Medicine

## 2020-06-09 NOTE — Telephone Encounter (Signed)
Ok to refill 

## 2020-06-30 ENCOUNTER — Other Ambulatory Visit: Payer: Self-pay | Admitting: Family Medicine

## 2020-07-01 NOTE — Telephone Encounter (Signed)
Pharmacy requests refill on: Cyclobenzaprine 5 mg   LAST REFILL: 08/13/2019 (Q-90, R-1) LAST OV: 06/03/2020 NEXT OV: 08/18/2020 PHARMACY: [id number] [place], [place]

## 2020-08-18 ENCOUNTER — Ambulatory Visit (INDEPENDENT_AMBULATORY_CARE_PROVIDER_SITE_OTHER): Payer: Managed Care, Other (non HMO) | Admitting: Family Medicine

## 2020-08-18 ENCOUNTER — Encounter: Payer: Self-pay | Admitting: Family Medicine

## 2020-08-18 ENCOUNTER — Other Ambulatory Visit: Payer: Self-pay

## 2020-08-18 ENCOUNTER — Other Ambulatory Visit (HOSPITAL_COMMUNITY)
Admission: RE | Admit: 2020-08-18 | Discharge: 2020-08-18 | Disposition: A | Discharge status: Home / Self Care | Payer: Managed Care, Other (non HMO) | Source: Ambulatory Visit | Attending: Family Medicine | Admitting: Family Medicine

## 2020-08-18 VITALS — BP 118/70 | HR 58 | Temp 97.4°F | Ht 62.0 in | Wt 190.2 lb

## 2020-08-18 DIAGNOSIS — Z124 Encounter for screening for malignant neoplasm of cervix: Secondary | ICD-10-CM | POA: Insufficient documentation

## 2020-08-18 DIAGNOSIS — D649 Anemia, unspecified: Secondary | ICD-10-CM | POA: Insufficient documentation

## 2020-08-18 DIAGNOSIS — Z0000 Encounter for general adult medical examination without abnormal findings: Secondary | ICD-10-CM

## 2020-08-18 DIAGNOSIS — E038 Other specified hypothyroidism: Secondary | ICD-10-CM | POA: Diagnosis not present

## 2020-08-18 DIAGNOSIS — M1990 Unspecified osteoarthritis, unspecified site: Secondary | ICD-10-CM | POA: Insufficient documentation

## 2020-08-18 LAB — TSH: TSH: 0.24 u[IU]/mL — ABNORMAL LOW (ref 0.35–4.50)

## 2020-08-18 NOTE — Patient Instructions (Signed)
For Bone Health  Would recommend the following:   1) 800 units of Vitamin D daily 2) Get 1200 mg of elemental calcium --- this is best from your diet. Try to track how much calcium you get on a typical day. You could find ways to add more (dairy products, leafy greens). Take a supplement for whatever you don't typically get so you reach 1200 mg of calcium.  3) Physical activity (ideally weight bearing) - like walking briskly 30 minutes 5 days a week.

## 2020-08-18 NOTE — Progress Notes (Signed)
Annual Exam   Chief Complaint:  Chief Complaint  Patient presents with  . Annual Exam    No concerns   . Gynecologic Exam    History of Present Illness:  [name] is a 60 y.o. No obstetric history on file. who LMP was Patient's last menstrual period was 04/23/2015., presents today for her annual examination.    #Hypothyroidism - has endocrinology appt next month - dose was lowered - did notice more tiredness with a lower dose  - not sleeping the same  Nutrition She does not get adequate calcium and Vitamin D in her diet. Diet: lunch is salad daily, snacking more, eating later Exercise: not currently  Safety The patient wears seatbelts: yes.     The patient feels safe at home and in their relationships: yes.   Menstrual:  Symptoms of menopause: none   GYN She is single partner, contraception - post menopausal status.    Cervical Cancer Screening (21-65):   Last Pap:   January 2017 Results were: no abnormalities /neg HPV DNA   Breast Cancer Screening (Age 63-74):  There is no FH of breast cancer. There is no FH of ovarian cancer. BRCA screening Not Indicated.  Last Mammogram: 01/15/2020 The patient does want a mammogram this year.    Colon Cancer Screening:  Age 47-75 yo - benefits outweigh the risk. Adults 47-85 yo who have never been screened benefit.  Benefits: 134000 people in 2016 will be diagnosed and 49,000 will die - early detection helps Harms: Complications 2/2 to colonoscopy High Risk (Colonoscopy): genetic disorder (Lynch syndrome or familial adenomatous polyposis), personal hx of IBD, previous adenomatous polyp, or previous colorectal cancer, FamHx start 10 years before the age at diagnosis, increased in males and black race  Options:  FIT - looks for hemoglobin (blood in the stool) - specific and fairly sensitive - must be done annually Cologuard - looks for DNA and blood - more sensitive - therefore can have more false positives, every  3 years Colonoscopy - every 10 years if normal - sedation, bowl prep, must have someone drive you  Shared decision making and the patient had decided to do Colonscopy - last done 03/2013 -- 10 year follow-up will update with the name of the provider.   Social History   Tobacco Use  Smoking Status Never Smoker  Smokeless Tobacco Never Used    Lung Cancer Screening (Ages 16-10): not applicable   Weight Wt Readings from Last 3 Encounters:  08/18/20 190 lb 4 oz (86.3 kg)  06/03/20 191 lb 8 oz (86.9 kg)  08/13/19 186 lb 8 oz (84.6 kg)   Patient has high BMI  BMI Readings from Last 1 Encounters:  08/18/20 34.80 kg/m     Chronic disease screening Blood pressure monitoring:  BP Readings from Last 3 Encounters:  08/18/20 118/70  06/03/20 108/70  08/13/19 104/78    Lipid Monitoring: Indication for screening: age >77, obesity, diabetes, family hx, CV risk factors.  Lipid screening: Yes  Lab Results  Component Value Date   CHOL 216 (H) 06/03/2020   HDL 41.10 06/03/2020   LDLCALC 153 (H) 06/03/2020   TRIG 108.0 06/03/2020   CHOLHDL 5 06/03/2020     Diabetes Screening: age >71, overweight, family hx, PCOS, hx of gestational diabetes, at risk ethnicity Diabetes Screening screening: Yes  Lab Results  Component Value Date   HGBA1C 5.9 06/03/2020     Past Medical History:  Diagnosis Date  . Thyroid disease  Past Surgical History:  Procedure Laterality Date  . CYST removal wrist Bilateral   . PITUITARY SURGERY      Prior to Admission medications   Medication Sig Start Date End Date Taking? Authorizing Provider  betamethasone dipropionate 0.05 % cream APPLY TO AFFECTED AREA TWICE A DAY 06/09/20  Yes Lesleigh Noe, MD  cyclobenzaprine (FLEXERIL) 5 MG tablet TAKE 1 TO 2 TABLETS 3 TIMES A DAY AS NEEDED FOR MUSCLE SPASM/NECK PAIN 07/01/20  Yes Lesleigh Noe, MD  hydrocortisone (CORTEF) 10 MG tablet TAKE 1 TABLET BY MOUTH TWICE A DAY 03/10/20  Yes Lesleigh Noe,  MD  levothyroxine (SYNTHROID) 75 MCG tablet Take 1 tablet (75 mcg total) by mouth daily before breakfast. 06/04/20  Yes Lesleigh Noe, MD  Vitamin D, Ergocalciferol, (DRISDOL) 1.25 MG (50000 UNIT) CAPS capsule TAKE 1 CAPSULE BY MOUTH ONE TIME PER WEEK 12/05/19  Yes Lesleigh Noe, MD    No Known Allergies  Gynecologic History: Patient's last menstrual period was 04/23/2015.  Obstetric History: No obstetric history on file.  Social History   Socioeconomic History  . Marital status: Single    Spouse name: Not on file  . Number of children: 1  . Years of education: Master's Degree  . Highest education level: Not on file  Occupational History  . Not on file  Tobacco Use  . Smoking status: Never Smoker  . Smokeless tobacco: Never Used  Vaping Use  . Vaping Use: Never used  Substance and Sexual Activity  . Alcohol use: Yes    Comment: wine once a week-other alcohol about once to twice a month  . Drug use: No  . Sexual activity: Yes    Birth control/protection: Post-menopausal  Other Topics Concern  . 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[employer], fashion/clothing, works with the government      Family: Alexis Wyatt (daughter) - living in [place], has cousins in [place] area      Enjoys: outdoor activities      Exercise: trying to walk in the neighborhood 3 times a week   Diet: not great      Safety   Seat belts: Yes    Guns: No   Safe in relationships: Yes    Social Determinants of Radio broadcast assistant Strain: Not on file  Food Insecurity: Not on file  Transportation Needs: Not on file  Physical Activity: Not on file  Stress: Not on file  Social Connections: Not on file  Intimate Partner Violence: Not on file    Family History  Problem Relation Age of Onset  . Breast cancer Sister 74  . Dementia Sister   . Dementia Mother   . Other Mother         protein deficiency-not sure the type  . Stroke Mother 70       mild  . Stroke Father 18  . Gout Maternal Grandmother   . Other Maternal Grandmother        heart issues but not sure of the specifics  . HIV/AIDS Sister     Review of Systems  Constitutional: Positive for malaise/fatigue. Negative for chills and fever.  HENT: Negative for congestion and sore throat.   Eyes: Negative for blurred vision and double vision.  Respiratory: Negative for shortness of breath.   Cardiovascular: Negative for chest pain.  Gastrointestinal:  Negative for heartburn, nausea and vomiting.  Genitourinary: Negative.   Musculoskeletal: Negative.  Negative for myalgias.  Skin: Negative for rash.  Neurological: Negative for dizziness and headaches.  Endo/Heme/Allergies: Does not bruise/bleed easily.  Psychiatric/Behavioral: Negative for depression. The patient is not nervous/anxious.      Physical Exam BP 118/70   Pulse (!) 58   Temp (!) 97.4 F (36.3 C) (Temporal)   Ht 5' 2"  (1.575 m)   Wt 190 lb 4 oz (86.3 kg)   LMP 04/23/2015 Comment: premenopausal, not preg  SpO2 98%   BMI 34.80 kg/m    BP Readings from Last 3 Encounters:  08/18/20 118/70  06/03/20 108/70  08/13/19 104/78      Physical Exam Exam conducted with a chaperone present.  Constitutional:      General: She is not in acute distress.    Appearance: She is well-developed. She is not diaphoretic.  HENT:     Head: Normocephalic and atraumatic.     Right Ear: External ear normal.     Left Ear: External ear normal.     Nose: Nose normal.  Eyes:     General: No scleral icterus.    Conjunctiva/sclera: Conjunctivae normal.  Cardiovascular:     Rate and Rhythm: Normal rate and regular rhythm.     Heart sounds: No murmur heard.   Pulmonary:     Effort: Pulmonary effort is normal. No respiratory distress.     Breath sounds: Normal breath sounds. No wheezing.  Abdominal:     General: Bowel sounds are normal. There is no  distension.     Palpations: Abdomen is soft. There is no mass.     Tenderness: There is no abdominal tenderness. There is no guarding or rebound.  Genitourinary:    Cervix: Normal.  Musculoskeletal:        General: Normal range of motion.     Cervical back: Neck supple.  Lymphadenopathy:     Cervical: No cervical adenopathy.  Skin:    General: Skin is warm and dry.     Capillary Refill: Capillary refill takes less than 2 seconds.  Neurological:     Mental Status: She is alert and oriented to person, place, and time.     Deep Tendon Reflexes: Reflexes normal.  Psychiatric:        Behavior: Behavior normal.     Results:  PHQ-9:   Depression screen [facility] 2/9 08/18/2020 08/13/2019  Decreased Interest 0 0  Down, Depressed, Hopeless 0 0  PHQ - 2 Score 0 0      Assessment: 60 y.o. No obstetric history on file. female here for routine annual physical examination.  Plan: Problem List Items Addressed This Visit      Endocrine   Acquired central hypothyroidism    Recent dose change. Will recheck TSH      Relevant Orders   TSH    Other Visit Diagnoses    Annual physical exam    -  Primary   Cervical cancer screening       Relevant Orders   Cytology - PAP      Screening: -- Blood pressure screen normal -- cholesterol screening: not due for screening -- Weight screening: overweight: continue to monitor -- Diabetes Screening: not due for screening -- Nutrition: Encouraged healthy diet  The 10-year ASCVD risk score Mikey Bussing DC Jr., et al., 2013) is: 4.5%   Values used to calculate the score:     Age: 21 years     Sex:  Female     Is Non-Hispanic African American: Yes     Diabetic: No     Tobacco smoker: No     Systolic Blood Pressure: 883 mmHg     Is BP treated: No     HDL Cholesterol: 41.1 mg/dL     Total Cholesterol: 216 mg/dL  -- Statin therapy for Age 58-75 with CVD risk >7.5%  Psych -- Depression screening (PHQ-9):    Safety -- tobacco screening: not  using -- alcohol screening:  low-risk usage. -- no evidence of domestic violence or intimate partner violence.   Cancer Screening -- pap smear collected per ASCCP guidelines -- family history of breast cancer screening: done. not at high risk. -- Mammogram - 12/2019 -- Colon cancer (age 27+)-- up to date. will find the name, due 2024  Immunizations Immunization History  Administered Date(s) Administered  . Influenza-Unspecified 03/27/2020  . PFIZER(Purple Top)SARS-COV-2 Vaccination 08/17/2019, 09/11/2019  . Tdap 05/22/2015  . Zoster Recombinat (Shingrix) 06/26/2018    -- flu vaccine up to date -- TDAP q10 years up to date -- Shingles (age >71) up to date -- Covid-19 Vaccine up to date   Encouraged healthy diet and exercise. Encouraged regular vision and dental care.    Lesleigh Noe, MD

## 2020-08-18 NOTE — Assessment & Plan Note (Signed)
Recent dose change. Will recheck TSH

## 2020-08-20 LAB — CYTOLOGY - PAP
Comment: NEGATIVE
High risk HPV: NEGATIVE

## 2020-08-27 ENCOUNTER — Other Ambulatory Visit: Payer: Self-pay | Admitting: Family Medicine

## 2020-08-27 DIAGNOSIS — R87619 Unspecified abnormal cytological findings in specimens from cervix uteri: Secondary | ICD-10-CM

## 2020-08-27 NOTE — Progress Notes (Signed)
Referral placed to follow-up abnormal PAP

## 2020-09-03 ENCOUNTER — Other Ambulatory Visit: Payer: Self-pay | Admitting: Family Medicine

## 2020-09-04 ENCOUNTER — Encounter: Payer: Self-pay | Admitting: Obstetrics and Gynecology

## 2020-09-04 ENCOUNTER — Other Ambulatory Visit (HOSPITAL_COMMUNITY)
Admission: RE | Admit: 2020-09-04 | Discharge: 2020-09-04 | Disposition: A | Discharge status: Home / Self Care | Payer: Managed Care, Other (non HMO) | Source: Ambulatory Visit | Attending: Obstetrics and Gynecology | Admitting: Obstetrics and Gynecology

## 2020-09-04 ENCOUNTER — Ambulatory Visit (INDEPENDENT_AMBULATORY_CARE_PROVIDER_SITE_OTHER): Payer: Managed Care, Other (non HMO) | Admitting: Obstetrics and Gynecology

## 2020-09-04 ENCOUNTER — Other Ambulatory Visit: Payer: Self-pay

## 2020-09-04 VITALS — BP 127/78 | HR 66 | Ht 62.0 in | Wt 193.2 lb

## 2020-09-04 DIAGNOSIS — R87619 Unspecified abnormal cytological findings in specimens from cervix uteri: Secondary | ICD-10-CM | POA: Diagnosis present

## 2020-09-04 NOTE — Progress Notes (Signed)
    GYNECOLOGY OFFICE COLPOSCOPY PROCEDURE NOTE  60 y.o. G1P1001 referred here from her PCP at [facility] for colposcopy due to glandular cell abnormality (AGUS) pap smear on 08/18/2020. Discussed role for HPV in cervical dysplasia, need for surveillance.  Patient gave informed written consent, time out was performed.  Placed in lithotomy position. Cervix viewed with speculum and colposcope after application of acetic acid.   Colposcopy adequate? No. Endocervical speculum used. Still unable to visualize transformation zone due to stenotic cervical os. Cervix also mildly flushed with vagina.  acetowhite lesion(s) noted at 4 o'clock; corresponding biopsies obtained.  ECC specimen obtained (cytobrush only due to cervical stenosis). Endometrial biopsy was performed (see additional procedure note below).  All specimens were labeled and sent to pathology.  Patient was given post procedure instructions.  Will follow up pathology and manage accordingly; patient will be contacted with results and recommendations.  Routine preventative health maintenance measures emphasized.    Endometrial Biopsy Procedure Note  After performance of the colposcopy (see procedure note above),  a single toothed tenaculum is placed onto the anterior lip of the cervix. The pipette was placed into the endocervical canal and is advanced to the uterine fundus. Using a piston like technique, with vacuum created by withdrawing the stylus, the endometrial specimen was obtained and transferred to the biopsy container. Minimal bleeding wasencountered. The procedure was well tolerated.    Uterine Position:mid    Uterine Length: 6 cm   Uterine Specimen: Scant   Post procedure instructions are given. The patient is scheduled for follow up appointment.     Rubie Maid, MD [facility]

## 2020-09-04 NOTE — Telephone Encounter (Signed)
Last office visit 08/18/2020 for CPE.  Last refilled betamethasone cream 06/09/2020 for 45 g with no refills.  Hydrocortisone 10 mg 03/10/2020 for #180 with 1 refill.  No future appointments with PCP.

## 2020-09-04 NOTE — Progress Notes (Signed)
Referral Pt- Atypical glandular cells on cervical pap smear.

## 2020-09-04 NOTE — Patient Instructions (Signed)
Colposcopy, Care After This sheet gives you information about how to care for yourself after your procedure. Your doctor may also give you more specific instructions. If you have problems or questions, contact your doctor. What can I expect after the procedure? If you did not have a sample of your tissue taken out (did not have a biopsy), you may only have some spotting of blood for a few days. You can go back to your normal activities. If you had a sample of your tissue taken out, it is common to have:  Soreness and mild pain. These may last for a few days.  A light-headed feeling.  Mild bleeding or fluid (discharge) coming from your vagina. The fluid will look dark and grainy. You may have this for a few days. The fluid may be caused by a liquid that was used during your procedure. You may need to wear a sanitary pad.  Spotting of blood for at least 48 hours after the procedure. Follow these instructions at home: Medicines  Take over-the-counter and prescription medicines only as told by your doctor.  Ask your doctor what medicines you can start taking again. This is very important if you take blood thinners. Activity  Limit your activity for the first day after your procedure as told by your doctor.  For at least 3 days, or for as long as told by your doctor, avoid: ? Douching. ? Using tampons. ? Having sex.  Return to your normal activities as told by your doctor. Ask your doctor what activities are safe for you. General instructions  Drink enough fluid to keep your pee (urine) pale yellow.  Ask your doctor if you may take baths, swim, or use a hot tub. You may take showers.  If you use birth control (contraception), keep using it.  Keep all follow-up visits as told by your doctor. This is important.   Contact a doctor if:  You get a skin rash. Get help right away if:  You bleed a lot from your vagina. A lot of bleeding means you use more than one pad an hour for 2  hours in a row.  You have clumps of blood (blood clots) coming from your vagina.  You have a fever or chills.  You have signs of infection. This may be fluid coming from your vagina that is: ? Different than normal. ? Yellow. ? Bad-smelling.  You have very bad pain or cramps in your lower belly that do not get better with medicine.  You faint. Summary  If you did not have a sample of your tissue taken out, you may only have some spotting of blood for a few days. You can go back to your normal activities.  If you had a sample of your tissue taken out, it is common to have mild pain for a few days and spotting for 48 hours.  Avoid douching, using tampons, and having sex for at least 3 days after the procedure or for as long as told.  Get help right away if you have a lot of bleeding, very bad pain, or signs of infection. This information is not intended to replace advice given to you by your health care provider. Make sure you discuss any questions you have with your health care provider. Document Revised: 03/11/2020 Document Reviewed: 05/09/2019 Elsevier Patient Education  2021 [employer].

## 2020-09-08 LAB — SURGICAL PATHOLOGY

## 2021-01-13 ENCOUNTER — Encounter: Payer: Self-pay | Admitting: Family Medicine

## 2021-02-16 ENCOUNTER — Other Ambulatory Visit: Payer: Self-pay | Admitting: Family Medicine

## 2021-02-20 ENCOUNTER — Encounter: Payer: Self-pay | Admitting: Family Medicine

## 2021-02-20 DIAGNOSIS — E038 Other specified hypothyroidism: Secondary | ICD-10-CM

## 2021-02-26 MED ORDER — LEVOTHYROXINE SODIUM 88 MCG PO TABS: 88.0000 ug | ORAL_TABLET | Freq: Every day | ORAL | 0 refills | Status: DC

## 2021-02-26 NOTE — Addendum Note (Signed)
Addended by: Waunita Schooner R on: 02/26/2021 10:16 AM   Modules accepted: Orders

## 2021-04-10 ENCOUNTER — Encounter: Payer: Self-pay | Admitting: Family Medicine

## 2021-05-21 ENCOUNTER — Other Ambulatory Visit: Payer: Self-pay | Admitting: Family Medicine

## 2021-05-22 ENCOUNTER — Encounter: Payer: Self-pay | Admitting: Family Medicine

## 2021-05-22 NOTE — Telephone Encounter (Signed)
Rx refilled and scheduled pt for CPE in March

## 2021-08-13 ENCOUNTER — Encounter: Payer: Self-pay | Admitting: Family Medicine

## 2021-08-17 ENCOUNTER — Ambulatory Visit (INDEPENDENT_AMBULATORY_CARE_PROVIDER_SITE_OTHER): Payer: Managed Care, Other (non HMO) | Admitting: Family Medicine

## 2021-08-17 ENCOUNTER — Other Ambulatory Visit: Payer: Self-pay

## 2021-08-17 ENCOUNTER — Encounter: Payer: Self-pay | Admitting: Family Medicine

## 2021-08-17 ENCOUNTER — Other Ambulatory Visit: Payer: Self-pay | Admitting: Family Medicine

## 2021-08-17 ENCOUNTER — Other Ambulatory Visit (HOSPITAL_COMMUNITY)
Admission: RE | Admit: 2021-08-17 | Discharge: 2021-08-17 | Disposition: A | Discharge status: Home / Self Care | Payer: Managed Care, Other (non HMO) | Source: Ambulatory Visit | Attending: Family Medicine | Admitting: Family Medicine

## 2021-08-17 VITALS — BP 112/70 | HR 68 | Temp 98.1°F | Ht 62.25 in | Wt 188.0 lb

## 2021-08-17 DIAGNOSIS — Z124 Encounter for screening for malignant neoplasm of cervix: Secondary | ICD-10-CM

## 2021-08-17 DIAGNOSIS — E038 Other specified hypothyroidism: Secondary | ICD-10-CM | POA: Diagnosis not present

## 2021-08-17 DIAGNOSIS — E782 Mixed hyperlipidemia: Secondary | ICD-10-CM | POA: Diagnosis not present

## 2021-08-17 DIAGNOSIS — E559 Vitamin D deficiency, unspecified: Secondary | ICD-10-CM

## 2021-08-17 DIAGNOSIS — Z0000 Encounter for general adult medical examination without abnormal findings: Secondary | ICD-10-CM

## 2021-08-17 DIAGNOSIS — R7303 Prediabetes: Secondary | ICD-10-CM

## 2021-08-17 LAB — COMPREHENSIVE METABOLIC PANEL
ALT: 23 U/L (ref 0–35)
AST: 17 U/L (ref 0–37)
Albumin: 4.6 g/dL (ref 3.5–5.2)
Alkaline Phosphatase: 66 U/L (ref 39–117)
BUN: 12 mg/dL (ref 6–23)
CO2: 28 mEq/L (ref 19–32)
Calcium: 9.8 mg/dL (ref 8.4–10.5)
Chloride: 103 mEq/L (ref 96–112)
Creatinine, Ser: 0.86 mg/dL (ref 0.40–1.20)
GFR: 73.79 mL/min (ref >60.00)
Glucose, Bld: 86 mg/dL (ref 70–99)
Potassium: 3.7 mEq/L (ref 3.5–5.1)
Sodium: 139 mEq/L (ref 135–145)
Total Bilirubin: 0.8 mg/dL (ref 0.2–1.2)
Total Protein: 7.9 g/dL (ref 6.0–8.3)

## 2021-08-17 LAB — CBC
HCT: 40.8 % (ref 36.0–46.0)
Hemoglobin: 13.3 g/dL (ref 12.0–15.0)
MCHC: 32.6 g/dL (ref 30.0–36.0)
MCV: 81.7 fl (ref 78.0–100.0)
Platelets: 378 10*3/uL (ref 150.0–400.0)
RBC: 4.99 Mil/uL (ref 3.87–5.11)
RDW: 13.8 % (ref 11.5–15.5)
WBC: 8.2 10*3/uL (ref 4.0–10.5)

## 2021-08-17 LAB — HEMOGLOBIN A1C: Hgb A1c MFr Bld: 6 % (ref 4.6–6.5)

## 2021-08-17 LAB — LIPID PANEL
Cholesterol: 232 mg/dL — ABNORMAL HIGH (ref 0–200)
HDL: 44.3 mg/dL (ref >39.00)
LDL Cholesterol: 157 mg/dL — ABNORMAL HIGH (ref 0–99)
NonHDL: 187.38
Total CHOL/HDL Ratio: 5
Triglycerides: 153 mg/dL — ABNORMAL HIGH (ref 0.0–149.0)
VLDL: 30.6 mg/dL (ref 0.0–40.0)

## 2021-08-17 NOTE — Patient Instructions (Signed)
Get an eye exam ? ?See the dentist ? ?Labs today ? ?Get your mammogram ?

## 2021-08-17 NOTE — Progress Notes (Signed)
Annual Exam  ? ?Chief Complaint:  ?Chief Complaint  ?Patient presents with  ? Annual Exam  ?  NO CONCERNS   ? ? ?History of Present Illness:  ?[name] is a 61 y.o. G1P1001 who LMP was Patient's last menstrual period was 04/23/2015., presents today for her annual examination.   ? ? ?Nutrition ?She does not get adequate calcium and Vitamin D in her diet. ?Diet: not good, working on healthy diet with family ?Exercise: planning to start in April  ? ? ? ?Social History  ? ?Tobacco Use  ?Smoking Status Never  ?Smokeless Tobacco Never  ? ?Social History  ? ?Substance and Sexual Activity  ?Alcohol Use Yes  ? Comment: wine once a week-other alcohol about once to twice a month  ? ?Social History  ? ?Substance and Sexual Activity  ?Drug Use No  ? ? ? ?General Health ?Dentist in the last year: No ?Eye doctor: no ? ?Safety ?The patient wears seatbelts: yes.     ?The patient feels safe at home and in their relationships: yes. ? ? ?Menstrual:  ?Post menopausal ? ?GYN ?She is single partner, contraception - post menopausal status.  ? ? ?Cervical Cancer Screening (21-65):   ?Last Pap:   March 2022 Results were: glandular cell abnormality (AGUS) /neg HPV DNA  ? ?Breast Cancer Screening (Age 24-74):  ?There is no FH of breast cancer. There is no FH of ovarian cancer. BRCA screening Not Indicated.  ?Last Mammogram: 01/15/2020 ?The patient does want a mammogram this year.  ? ? ?Colon Cancer Screening:  ?Age 23-75 yo - benefits outweigh the risk. Adults 74-85 yo who have never been screened benefit.  ?Benefits: 134000 people in 2016 will be diagnosed and 49,000 will die - early detection helps ?Harms: Complications 2/2 to colonoscopy ?High Risk (Colonoscopy): genetic disorder (Lynch syndrome or familial adenomatous polyposis), personal hx of IBD, previous adenomatous polyp, or previous colorectal cancer, FamHx start 10 years before the age at diagnosis, increased in males and black race ? ?Options:  ?FIT - looks for  hemoglobin (blood in the stool) - specific and fairly sensitive - must be done annually ?Cologuard - looks for DNA and blood - more sensitive - therefore can have more false positives, every 3 years ?Colonoscopy - every 10 years if normal - sedation, bowl prep, must have someone drive you ? ?Shared decision making and the patient had decided to do 2014 and said 10 years for colonoscopy. ? ? ?Social History  ? ?Tobacco Use  ?Smoking Status Never  ?Smokeless Tobacco Never  ? ? ?Lung Cancer Screening (Ages 03-70): not applicable ? ? ?Weight ?Wt Readings from Last 3 Encounters:  ?08/17/21 188 lb (85.3 kg)  ?09/04/20 193 lb 3.2 oz (87.6 kg)  ?08/18/20 190 lb 4 oz (86.3 kg)  ? ?Patient has high BMI  ?BMI Readings from Last 1 Encounters:  ?08/17/21 34.11 kg/m?  ? ? ? ?Chronic disease screening ?Blood pressure monitoring:  ?BP Readings from Last 3 Encounters:  ?08/17/21 112/70  ?09/04/20 127/78  ?08/18/20 118/70  ? ? ?Lipid Monitoring: Indication for screening: age >25, obesity, diabetes, family hx, CV risk factors.  ?Lipid screening: Yes ? ?Lab Results  ?Component Value Date  ? CHOL 216 (H) 06/03/2020  ? HDL 41.10 06/03/2020  ? LDLCALC 153 (H) 06/03/2020  ? TRIG 108.0 06/03/2020  ? CHOLHDL 5 06/03/2020  ? ? ? ?Diabetes Screening: age >23, overweight, family hx, PCOS, hx of gestational diabetes, at risk ethnicity ?Diabetes Screening screening:  Yes ? ?Lab Results  ?Component Value Date  ? HGBA1C 5.9 06/03/2020  ? ? ? ?Past Medical History:  ?Diagnosis Date  ? Thyroid disease   ? ? ?Past Surgical History:  ?Procedure Laterality Date  ? CYST removal wrist Bilateral   ? PITUITARY SURGERY    ? ? ?Prior to Admission medications   ?Medication Sig Start Date End Date Taking? Authorizing Provider  ?betamethasone dipropionate 0.05 % cream APPLY TO AFFECTED AREA TWICE A DAY 09/04/20  Yes Lesleigh Noe, MD  ?cyclobenzaprine (FLEXERIL) 5 MG tablet TAKE 1 TO 2 TABLETS 3 TIMES A DAY AS NEEDED FOR MUSCLE SPASM/NECK PAIN 07/01/20  Yes Lesleigh Noe, MD  ?hydrocortisone (CORTEF) 10 MG tablet TAKE 1 TABLET BY MOUTH TWICE A DAY 02/18/21  Yes Lesleigh Noe, MD  ?levothyroxine (SYNTHROID) 88 MCG tablet TAKE 1 TABLET BY MOUTH DAILY BEFORE BREAKFAST. 05/22/21  Yes Lesleigh Noe, MD  ? ? ?No Known Allergies ? ?Gynecologic History: Patient's last menstrual period was 04/23/2015. ? ?Obstetric History: G1P1001 ? ?Social History  ? ?Socioeconomic History  ? Marital status: Single  ?  Spouse name: Not on file  ? Number of children: 1  ? Years of education: Master's Degree  ? Highest education level: Not on file  ?Occupational History  ? Not on file  ?Tobacco Use  ? Smoking status: Never  ? Smokeless tobacco: Never  ?Vaping Use  ? Vaping Use: Never used  ?Substance and Sexual Activity  ? Alcohol use: Yes  ?  Comment: wine once a week-other alcohol about once to twice a month  ? Drug use: No  ? Sexual activity: Yes  ?  Birth control/protection: Post-menopausal  ?Other Topics Concern  ? Not on file  ?Social History Narrative  ? 08/17/21  ? From: [place], [place] originally, moved to be near family  ? Living: alone currently  ? 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   ? Family: Alexis Wyatt (daughter) - grandson (2023)  ?   ? Enjoys: outdoor activities  ?   ? Exercise: trying to walk in the neighborhood 3 times a week  ? Diet: not great  ?   ? Safety  ? Seat belts: Yes   ? Guns: No  ? Safe in relationships: Yes   ? ?Social Determinants of Health  ? ?Financial Resource Strain: Not on file  ?Food Insecurity: Not on file  ?Transportation Needs: Not on file  ?Physical Activity: Not on file  ?Stress: Not on file  ?Social Connections: Not on file  ?Intimate Partner Violence: Not on file  ? ? ?Family History  ?Problem Relation Age of Onset  ? Dementia Mother   ? Other Mother   ?     protein deficiency-not sure the type  ? Stroke Mother 6  ?     mild  ? Stroke Father 47  ? Breast cancer Sister 53  ? Dementia Sister   ? HIV/AIDS Sister   ? Gout  Maternal Grandmother   ? Other Maternal Grandmother   ?     heart issues but not sure of the specifics  ? Heart attack Nephew 40  ? ? ?Review of Systems  ?Constitutional:  Negative for chills and fever.  ?HENT:  Negative for congestion and sore throat.   ?Eyes:  Negative for blurred vision and double vision.  ?Respiratory:  Negative for shortness of breath.   ?Cardiovascular:  Negative for chest pain.  ?Gastrointestinal:  Negative for  heartburn, nausea and vomiting.  ?Genitourinary: Negative.   ?Musculoskeletal: Negative.  Negative for myalgias.  ?Skin:  Negative for rash.  ?Neurological:  Negative for dizziness and headaches.  ?Endo/Heme/Allergies:  Does not bruise/bleed easily.  ?Psychiatric/Behavioral:  Negative for depression. The patient is not nervous/anxious.    ? ?Physical Exam ?BP 112/70   Pulse 68   Temp 98.1 ?F (36.7 ?C) (Oral)   Ht 5' 2.25" (1.581 m)   Wt 188 lb (85.3 kg)   LMP 04/23/2015 Comment: premenopausal, not preg  BMI 34.11 kg/m?   ? ?BP Readings from Last 3 Encounters:  ?08/17/21 112/70  ?09/04/20 127/78  ?08/18/20 118/70  ? ? ? ? ?Physical Exam ?Constitutional:   ?   General: She is not in acute distress. ?   Appearance: She is well-developed. She is not diaphoretic.  ?HENT:  ?   Head: Normocephalic and atraumatic.  ?   Right Ear: External ear normal.  ?   Left Ear: External ear normal.  ?   Nose: Nose normal.  ?Eyes:  ?   General: No scleral icterus. ?   Extraocular Movements: Extraocular movements intact.  ?   Conjunctiva/sclera: Conjunctivae normal.  ?Cardiovascular:  ?   Rate and Rhythm: Normal rate and regular rhythm.  ?   Heart sounds: No murmur heard. ?Pulmonary:  ?   Effort: Pulmonary effort is normal. No respiratory distress.  ?   Breath sounds: Normal breath sounds. No wheezing.  ?Abdominal:  ?   General: Bowel sounds are normal. There is no distension.  ?   Palpations: Abdomen is soft. There is no mass.  ?   Tenderness: There is no abdominal tenderness. There is no guarding or  rebound.  ?Musculoskeletal:     ?   General: Normal range of motion.  ?   Cervical back: Neck supple.  ?Lymphadenopathy:  ?   Cervical: No cervical adenopathy.  ?Skin: ?   General: Skin is warm and dry.  ?   [place]

## 2021-08-18 ENCOUNTER — Other Ambulatory Visit: Payer: Self-pay | Admitting: Family Medicine

## 2021-08-18 DIAGNOSIS — E559 Vitamin D deficiency, unspecified: Secondary | ICD-10-CM

## 2021-08-18 LAB — VITAMIN D 25 HYDROXY (VIT D DEFICIENCY, FRACTURES): VITD: 22.72 ng/mL — ABNORMAL LOW (ref 30.00–100.00)

## 2021-08-18 LAB — TSH: TSH: 0.04 u[IU]/mL — ABNORMAL LOW (ref 0.35–5.50)

## 2021-08-18 MED ORDER — VITAMIN D (ERGOCALCIFEROL) 50000 UNITS PO CAPS: 1.0000 | ORAL_CAPSULE | ORAL | 1 refills | Status: DC

## 2021-08-19 LAB — CYTOLOGY - PAP
Comment: NEGATIVE
Diagnosis: NEGATIVE
Diagnosis: REACTIVE
High risk HPV: NEGATIVE

## 2021-08-20 ENCOUNTER — Encounter (INDEPENDENT_AMBULATORY_CARE_PROVIDER_SITE_OTHER): Payer: Self-pay

## 2021-08-20 ENCOUNTER — Encounter: Payer: Self-pay | Admitting: Family Medicine

## 2021-08-20 DIAGNOSIS — E038 Other specified hypothyroidism: Secondary | ICD-10-CM

## 2021-08-20 DIAGNOSIS — E2740 Unspecified adrenocortical insufficiency: Secondary | ICD-10-CM

## 2021-08-20 DIAGNOSIS — D352 Benign neoplasm of pituitary gland: Secondary | ICD-10-CM

## 2021-08-20 MED ORDER — HYDROCORTISONE 10 MG PO TABS: 10.0000 mg | ORAL_TABLET | Freq: Two times a day (BID) | ORAL | 0 refills | Status: DC

## 2021-08-20 MED ORDER — LEVOTHYROXINE SODIUM 88 MCG PO TABS: 88.0000 ug | ORAL_TABLET | Freq: Every day | ORAL | 0 refills | Status: DC

## 2021-09-10 ENCOUNTER — Other Ambulatory Visit: Payer: Self-pay | Admitting: Family Medicine

## 2021-09-10 DIAGNOSIS — E559 Vitamin D deficiency, unspecified: Secondary | ICD-10-CM

## 2021-09-16 ENCOUNTER — Encounter: Payer: Self-pay | Admitting: Family Medicine

## 2021-09-16 NOTE — Telephone Encounter (Signed)
Dr Einar Pheasant,  ? ?The mix up was a mistake on my behalf. Upon routing the referral I chose the wrong department (they are side by side on my screen and I clicked the wrong one) and did not catch it.   ? ?I have sent the patient a mychart apologizing and the correct referral information attached.  ? ?Sorry for the confusion and mix up.  ?

## 2021-10-13 ENCOUNTER — Other Ambulatory Visit: Payer: Self-pay | Admitting: Family Medicine

## 2021-10-13 ENCOUNTER — Encounter: Payer: Self-pay | Admitting: Family Medicine

## 2021-10-13 DIAGNOSIS — E038 Other specified hypothyroidism: Secondary | ICD-10-CM

## 2021-10-13 DIAGNOSIS — E2740 Unspecified adrenocortical insufficiency: Secondary | ICD-10-CM

## 2021-10-13 DIAGNOSIS — D352 Benign neoplasm of pituitary gland: Secondary | ICD-10-CM

## 2021-10-14 MED ORDER — HYDROCORTISONE 10 MG PO TABS: 10.0000 mg | ORAL_TABLET | Freq: Two times a day (BID) | ORAL | 2 refills | Status: DC

## 2021-10-14 MED ORDER — LEVOTHYROXINE SODIUM 88 MCG PO TABS: 88.0000 ug | ORAL_TABLET | Freq: Every day | ORAL | 2 refills | Status: DC

## 2022-01-20 LAB — HM MAMMOGRAPHY

## 2022-01-21 ENCOUNTER — Encounter: Payer: Self-pay | Admitting: Family Medicine

## 2022-02-02 ENCOUNTER — Encounter: Payer: Self-pay | Admitting: Family Medicine

## 2022-02-02 DIAGNOSIS — Z1211 Encounter for screening for malignant neoplasm of colon: Secondary | ICD-10-CM

## 2022-02-04 ENCOUNTER — Encounter: Payer: Self-pay | Admitting: Family Medicine

## 2022-02-04 DIAGNOSIS — M545 Other chronic pain: Secondary | ICD-10-CM

## 2022-02-23 ENCOUNTER — Other Ambulatory Visit: Payer: Self-pay | Admitting: Family Medicine

## 2022-05-03 ENCOUNTER — Ambulatory Visit: Payer: Managed Care, Other (non HMO) | Admitting: Internal Medicine

## 2022-05-03 ENCOUNTER — Encounter: Payer: Self-pay | Admitting: Internal Medicine

## 2022-05-03 VITALS — BP 114/72 | HR 75 | Ht 62.0 in | Wt 188.2 lb

## 2022-05-03 DIAGNOSIS — E038 Other specified hypothyroidism: Secondary | ICD-10-CM | POA: Diagnosis not present

## 2022-05-03 DIAGNOSIS — E893 Postprocedural hypopituitarism: Secondary | ICD-10-CM | POA: Diagnosis not present

## 2022-05-03 DIAGNOSIS — D352 Benign neoplasm of pituitary gland: Secondary | ICD-10-CM

## 2022-05-03 DIAGNOSIS — E2749 Other adrenocortical insufficiency: Secondary | ICD-10-CM | POA: Diagnosis not present

## 2022-05-03 DIAGNOSIS — E2740 Unspecified adrenocortical insufficiency: Secondary | ICD-10-CM

## 2022-05-03 DIAGNOSIS — L659 Nonscarring hair loss, unspecified: Secondary | ICD-10-CM

## 2022-05-03 LAB — BASIC METABOLIC PANEL
BUN: 15 mg/dL (ref 6–23)
CO2: 24 mEq/L (ref 19–32)
Calcium: 9.3 mg/dL (ref 8.4–10.5)
Chloride: 107 mEq/L (ref 96–112)
Creatinine, Ser: 0.78 mg/dL (ref 0.40–1.20)
GFR: 82.55 mL/min (ref >60.00)
Glucose, Bld: 105 mg/dL — ABNORMAL HIGH (ref 70–99)
Potassium: 3.9 mEq/L (ref 3.5–5.1)
Sodium: 140 mEq/L (ref 135–145)

## 2022-05-03 LAB — CORTISOL: Cortisol, Plasma: 15.3 ug/dL

## 2022-05-03 LAB — FOLLICLE STIMULATING HORMONE: FSH: 10.6 m[IU]/mL

## 2022-05-03 LAB — T4, FREE: Free T4: 1.06 ng/dL (ref 0.60–1.60)

## 2022-05-03 MED ORDER — LEVOTHYROXINE SODIUM 88 MCG PO TABS: 88.0000 ug | ORAL_TABLET | Freq: Every day | ORAL | 3 refills | Status: DC

## 2022-05-03 MED ORDER — HYDROCORTISONE 10 MG PO TABS: ORAL_TABLET | ORAL | 2 refills | Status: DC

## 2022-05-03 NOTE — Patient Instructions (Signed)
Hydrocortisone 10 mg, 1.5 tablets in the morning and half a tablet between 2-4 pm daily     ADRENAL INSUFFICIENCY SICK DAY RULES:  Should you face an extreme emotional or physical stress such as trauma, surgery or acute illness, this will require extra steroid coverage so that the body can meet that stress.   Without increasing the steroid dose you may experience severe weakness, headache, dizziness, nausea and vomiting and possibly a more serious deterioration in health.  Typically the dose of steroids will only need to be increased for a couple of days if you have an illness that is transient and managed in the community.   If you are unable to take/absorb an increased dose of steroids orally because of vomiting or diarrhea, you will urgently require steroid injections and should present to an Emergency Department.  The general advice for any serious illness is as follows: Double the normal daily steroid dose for up to 3 days if you have a temperature of more than 37.50C (99.72F) with signs of sickness, or severe emotional or physical distress Contact your primary care doctor and Endocrinologist if the illness worsens or it lasts for more than 3 days.  In cases of severe illness, urgent medical assistance should be promptly sought.

## 2022-05-03 NOTE — Progress Notes (Signed)
Name: Alexis Wyatt  MRN/ DOB: [id number], 1961/01/10    Age/ Sex: 61 y.o., female    PCP: Waunita Schooner, MD   Reason for Endocrinology Evaluation: Hx pituitary adenoma     Date of Initial Endocrinology Evaluation: 05/03/2022     HPI: [name] is a 61 y.o. female with a past medical history of depression, dyslipidemia,Hx of pituitary adenoma. The patient presented for initial endocrinology clinic visit on 05/03/2022 for consultative assistance with her Hx of pituitary adenoma.   She was noted with pituitary adenoma in 2010, through brain MRI 1.2 x 0.8 cm.  She is not aware of any hormonal problems because of the adenoma.  She went through menopause in 2015  She is s/p resection and 03/2009 at [facility].  Pathology was consistent with pituitary adenoma, no other details.  Migraines resolved postoperatively.  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  No evidence of DI  She was seen by a local endocrinologist in [place] from 2020-2012  MRI in 2013 did not show any recurrence of adenoma MRI 2018 was unremarkable   She started following up with [facility] endocrinology in 2017 Saw Dr. Gabriel Carina in 2018 and no further MRIs were recommended  She was seen by [facility] endocrinology for hypothyroidism on April 12th,2022  Denies local neck swelling  Last week she had diarrhea while on conference  Denies palpitations  Denies tremors  Has tightened shoe size but no change in shoe size  She forgets to take her afternoon dose  in the evening , she has issues with insomnia  She has a medical alert bracelet but has rash with it ,hence intermittent use  Denies dizziness  Has noted hair loss including eye brown and lashes  Denies headache or vision changes No Biotin   No Fh of thyroid or pituitary issues    Levothyroxine 88 mcg daily Hydrocortisone 10 mg  twice daily      HISTORY:  Past Medical History:  Past Medical History:  Diagnosis Date   Thyroid disease    Past Surgical History:  Past Surgical History:  Procedure Laterality Date   CYST removal wrist Bilateral    PITUITARY SURGERY      Social History:  reports that she has never smoked. She has never used smokeless tobacco. She reports current alcohol use. She reports that she does not use drugs. Family History: family history includes Breast cancer (age of onset: 60) in her sister; Dementia in her mother and sister; Gout in her maternal grandmother; HIV/AIDS in her sister; Heart attack (age of onset: 31) in her nephew; Other in her maternal grandmother and mother; Stroke (age of onset: 84) in her father; Stroke (age of onset: 19) in her mother.   HOME MEDICATIONS: Allergies as of 05/03/2022   No Known Allergies      Medication List        Accurate as of May 03, 2022  8:10 AM. If you have any questions, ask your nurse or doctor.          betamethasone dipropionate 0.05 % cream APPLY TO AFFECTED AREA TWICE A DAY   cyclobenzaprine 5 MG tablet Commonly known as: FLEXERIL TAKE 1 TO 2 TABLETS 3 TIMES A DAY AS NEEDED FOR MUSCLE SPASM/NECK PAIN   hydrocortisone 10 MG tablet Commonly known as: CORTEF Take 1 tablet (10 mg total) by mouth 2 (two) times daily.  levothyroxine 88 MCG tablet Commonly known as: SYNTHROID Take 1 tablet (88 mcg total) by mouth daily before breakfast.   Vitamin D (Ergocalciferol) 50000 units Caps Take 1 capsule by mouth once a week.          REVIEW OF SYSTEMS: A comprehensive ROS was conducted with the patient and is negative except as per HPI and below:  ROS     OBJECTIVE:  VS: BP 114/72 (BP Location: Left Arm, Patient Position: Sitting, Cuff Size: Large)   Pulse 75   Ht '5\' 2"'$  (1.575 m)   Wt 188 lb 3.2 oz (85.4 kg)   LMP 04/23/2015 Comment: premenopausal, not preg  SpO2 94%   BMI 34.42 kg/m    Wt Readings from  Last 3 Encounters:  05/03/22 188 lb 3.2 oz (85.4 kg)  08/17/21 188 lb (85.3 kg)  09/04/20 193 lb 3.2 oz (87.6 kg)   Body surface area is 1.93 meters squared.   EXAM: General: Pt appears well and is in NAD  Eyes: External eye exam normal without stare, lid lag or exophthalmos.  EOM intact.  PERRL.  Neck: General: Supple without adenopathy. Thyroid: Thyroid size normal.  No goiter or nodules appreciated. No thyroid bruit.  Lungs: Clear with good BS bilat with no rales, rhonchi, or wheezes  Heart: Auscultation: RRR.  Abdomen: Normoactive bowel sounds, soft, nontender, without masses or organomegaly palpable  Extremities:  BL LE: No pretibial edema normal ROM and strength.  Mental Status: Judgment, insight: Intact Orientation: Oriented to time, place, and person Mood and affect: No depression, anxiety, or agitation     DATA REVIEWED:   Latest Reference Range & Units 05/03/22 08:34  Sodium 135 - 145 mEq/L 140  Potassium 3.5 - 5.1 mEq/L 3.9  Chloride 96 - 112 mEq/L 107  CO2 19 - 32 mEq/L 24  Glucose 70 - 99 mg/dL 105 (H)  BUN 6 - 23 mg/dL 15  Creatinine 0.40 - 1.20 mg/dL 0.78  Calcium 8.4 - 10.5 mg/dL 9.3  GFR >60.00 mL/min 82.55    Latest Reference Range & Units 05/03/22 08:34  Cortisol, Plasma ug/dL 15.3  FSH mIU/ML 10.6  Glucose 70 - 99 mg/dL 105 (H)  T4,Free(Direct) 0.60 - 1.60 ng/dL 1.06      09/04/2020 Antithyroglobulin antibody negative   ASSESSMENT/PLAN/RECOMMENDATIONS:   Hx pituitary macroadenoma:   -S/p transsphenoidal pituitary resection in 2010 -Serial MRIs postoperatively were nonrevealing and no further imaging was recommended in 2018 -No local symptoms -BMP within normal range -ACTH pending  2.  Secondary hypothyroidism  -Patient is clinically euthyroid except for hair loss - Pt educated extensively on the correct way to take levothyroxine (first thing in the morning with water, 30 minutes before eating or taking other medications). - Pt  encouraged to double dose the following day if she were to miss a dose given long half-life of levothyroxine. -Free T4 normal   Medication Continue levothyroxine 88 mcg daily  3.  Secondary adrenal insufficiency:   -Emphasized the importance of wearing a medical alert bracelet, she develops a local reaction when she wears the current one, patient advised to change the metal -We discussed sick day rules -She is currently taking hydrocortisone 10 mg twice daily, I did recommend switching the dose to taking 2/3rd of the dose in the morning and one third between 2 and 4 PM   Medications : Change hydrocortisone 10 mg, 1.5 tabs every morning and half a tablet between 2-4 PM daily    Follow-up in 4 months  Signed electronically by: Mack Guise, MD  [facility] Endocrinology  [facility] [address] [place], [place] [address] Phone: [phone] FAX: [phone]   CC: Waunita Schooner, [place] [address] Phone: [phone] Fax: [phone]   Return to Endocrinology clinic as below: No future appointments.

## 2022-05-07 LAB — ACTH: C206 ACTH: 5 pg/mL — ABNORMAL LOW (ref 6–50)

## 2022-05-07 LAB — TESTOSTERONE, TOTAL, LC/MS/MS: Testosterone, Total, LC-MS-MS: 4 ng/dL (ref 2–45)

## 2022-05-07 LAB — INSULIN-LIKE GROWTH FACTOR
IGF-I, LC/MS: 46 ng/mL (ref 41–279)
Z-Score (Female): -2 SD (ref ?–2.0)

## 2022-05-07 LAB — PROLACTIN: Prolactin: 1.6 ng/mL — ABNORMAL LOW

## 2022-07-17 ENCOUNTER — Encounter: Payer: Self-pay | Admitting: Family

## 2022-07-17 DIAGNOSIS — M545 Low back pain, unspecified: Secondary | ICD-10-CM

## 2022-07-17 DIAGNOSIS — L309 Dermatitis, unspecified: Secondary | ICD-10-CM

## 2022-07-19 MED ORDER — CYCLOBENZAPRINE HCL 5 MG PO TABS: ORAL_TABLET | ORAL | 1 refills | Status: DC

## 2022-07-19 MED ORDER — BETAMETHASONE DIPROPIONATE 0.05 % EX CREA: TOPICAL_CREAM | CUTANEOUS | 1 refills | Status: DC

## 2022-07-19 NOTE — Addendum Note (Signed)
Addended by: Eugenia Pancoast on: 07/19/2022 05:07 PM   Modules accepted: Orders

## 2022-08-26 ENCOUNTER — Ambulatory Visit: Payer: Managed Care, Other (non HMO) | Admitting: Internal Medicine

## 2022-08-26 ENCOUNTER — Encounter: Payer: Self-pay | Admitting: Internal Medicine

## 2022-08-26 VITALS — BP 128/78 | HR 100 | Ht 62.0 in | Wt 188.2 lb

## 2022-08-26 DIAGNOSIS — E038 Other specified hypothyroidism: Secondary | ICD-10-CM | POA: Diagnosis not present

## 2022-08-26 DIAGNOSIS — E2749 Other adrenocortical insufficiency: Secondary | ICD-10-CM | POA: Diagnosis not present

## 2022-08-26 DIAGNOSIS — E893 Postprocedural hypopituitarism: Secondary | ICD-10-CM | POA: Diagnosis not present

## 2022-08-26 DIAGNOSIS — E2740 Unspecified adrenocortical insufficiency: Secondary | ICD-10-CM | POA: Diagnosis not present

## 2022-08-26 DIAGNOSIS — D352 Benign neoplasm of pituitary gland: Secondary | ICD-10-CM

## 2022-08-26 LAB — BASIC METABOLIC PANEL
BUN: 15 mg/dL (ref 6–23)
CO2: 24 mEq/L (ref 19–32)
Calcium: 9.5 mg/dL (ref 8.4–10.5)
Chloride: 106 mEq/L (ref 96–112)
Creatinine, Ser: 0.92 mg/dL (ref 0.40–1.20)
GFR: 67.57 mL/min (ref >60.00)
Glucose, Bld: 130 mg/dL — ABNORMAL HIGH (ref 70–99)
Potassium: 3.6 mEq/L (ref 3.5–5.1)
Sodium: 139 mEq/L (ref 135–145)

## 2022-08-26 LAB — T4, FREE: Free T4: 0.99 ng/dL (ref 0.60–1.60)

## 2022-08-26 MED ORDER — HYDROCORTISONE 10 MG PO TABS: 10.0000 mg | ORAL_TABLET | Freq: Two times a day (BID) | ORAL | 3 refills | Status: DC

## 2022-08-26 NOTE — Progress Notes (Signed)
Name: Alexis Wyatt  MRN/ DOB: [id number], 1960-10-27    Age/ Sex: 62 y.o., female    PCP: Waunita Schooner, MD   Reason for Endocrinology Evaluation: Hx pituitary adenoma     Date of Initial Endocrinology Evaluation: 05/03/2022    HPI: [name] is a 62 y.o. female with a past medical history of depression, dyslipidemia,Hx of pituitary adenoma. The patient presented for initial endocrinology clinic visit on 05/03/2022 for consultative assistance with her Hx of pituitary adenoma.   She was noted with pituitary adenoma in 2010, through brain MRI 1.2 x 0.8 cm.  She is not aware of any hormonal problems because of the adenoma.  She went through menopause in 2015  She is s/p resection and 03/2009 at [facility].  Pathology was consistent with pituitary adenoma, no other details.  Migraines resolved postoperatively.  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  No evidence of DI  She was seen by a local endocrinologist in [place] from 2020-2012  MRI in 2013 did not show any recurrence of adenoma MRI 2018 was unremarkable   She started following up with [facility] endocrinology in 2017 Saw Dr. Gabriel Carina in 2018 and no further MRIs were recommended  She was seen by [facility] endocrinology for hypothyroidism on April 12th,2022  No Fh of thyroid or pituitary issues   On her initial visit with me she was on Levothyroxine and hydrocortisone     Her ACTH was undetectable at 5 pg/mL , normal but low end of normal IGF-1 as well as low prolactin  I have attempted to divide her hydrocortisone dose to two thirds in the morning and one third in the afternoon but cutting the tablet was cumbersome and she opted to stay on twice daily dosing     SUBJECTIVE:    Today (08/26/22):  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  Weight stable  She tried  to do 1.5 in AM and half in the afternoon but this was cumbersome and she is back to taking 1 tablet twice daily Denies local neck swelling  Denies palpitations  Denies tremors   She has a medical alert bracelet  Denies dizziness  Denies headaches  Has noted unclear vision in the mornings  No Biotin   Hydrocortisone 10 mg, 1.5 tabs every morning and half a tablet between 2-4 PM daily- She takes BID  Levothyroxine 88 mcg daily     HISTORY:  Past Medical History:  Past Medical History:  Diagnosis Date   Thyroid disease    Past Surgical History:  Past Surgical History:  Procedure Laterality Date   CYST removal wrist Bilateral    PITUITARY SURGERY      Social History:  reports that she has never smoked. She has never used smokeless tobacco. She reports current alcohol use. She reports that she does not use drugs. Family History: family history includes Breast cancer (age of onset: 29) in her sister; Dementia in her mother and sister; Gout in her maternal grandmother; HIV/AIDS in her sister; Heart attack (age of onset: 80) in her nephew; Other in her maternal grandmother and mother; Stroke (age of onset: 45) in her father; Stroke (age of onset: 50) in her mother.   HOME MEDICATIONS: Allergies as of 08/26/2022   No Known Allergies      Medication List        Accurate as of August 26, 2022  1:06 PM. If you have any questions, ask your nurse or doctor.          betamethasone dipropionate 0.05 % cream APPLY TO AFFECTED AREA TWICE A DAY   cyclobenzaprine 5 MG tablet Commonly known as: FLEXERIL Take one po qhs prn muscle spasm   hydrocortisone 10 MG tablet Commonly known as: CORTEF Take 1.5 tablets (15 mg total) by mouth in the morning AND 0.5 tablets (5 mg total) as directed. 1.5 tabs QAM and half a tab between 2-4 pm daily.   levothyroxine 88 MCG tablet Commonly known as: SYNTHROID Take 1 tablet (88 mcg total) by mouth daily before breakfast.   Vitamin D  (Ergocalciferol) 50000 units Caps Take 1 capsule by mouth once a week.          REVIEW OF SYSTEMS: A comprehensive ROS was conducted with the patient and is negative except as per HPI    OBJECTIVE:  VS: BP 128/78 (BP Location: Right Arm, Patient Position: Sitting, Cuff Size: Normal)   Pulse 100   Ht  (1.575 m)   Wt 188 lb 3.2 oz (85.4 kg)   LMP 04/23/2015 Comment: premenopausal, not preg  SpO2 96%   BMI 34.42 kg/m    Wt Readings from Last 3 Encounters:  08/26/22 188 lb 3.2 oz (85.4 kg)  05/03/22 188 lb 3.2 oz (85.4 kg)  08/17/21 188 lb (85.3 kg)   Body surface area is 1.93 meters squared.   EXAM: General: Pt appears well and is in NAD  Eyes: External eye exam normal without stare, lid lag or exophthalmos.  EOM intact.    Neck: General: Supple without adenopathy. Thyroid: Thyroid size normal.  No goiter or nodules appreciated.  Lungs: Clear with good BS bilat with no rales, rhonchi, or wheezes  Heart: Auscultation: RRR.  Abdomen: Normoactive bowel sounds, soft, nontender, without masses or organomegaly palpable  Extremities:  BL LE: No pretibial edema normal ROM and strength.  Mental Status: Judgment, insight: Intact Orientation: Oriented to time, place, and person Mood and affect: No depression, anxiety, or agitation     DATA REVIEWED:   Latest Reference Range & Units 08/26/22 13:25  Sodium 135 - 145 mEq/L 139  Potassium 3.5 - 5.1 mEq/L 3.6  Chloride 96 - 112 mEq/L 106  CO2 19 - 32 mEq/L 24  Glucose 70 - 99 mg/dL 478 (H)  BUN 6 - 23 mg/dL 15  Creatinine 2.95 - 6.21 mg/dL 3.08  Calcium 8.4 - 65.7 mg/dL 9.5  GFR >84.69 mL/min 67.57    Latest Reference Range & Units 08/26/22 13:25  T4,Free(Direct) 0.60 - 1.60 ng/dL 6.29     10/19/4130 Antithyroglobulin antibody negative   ASSESSMENT/PLAN/RECOMMENDATIONS:   Hx pituitary macroadenoma:   -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  Secondary hypothyroidism  -Patient is clinically euthyroid  - Pt educated extensively on the correct way to take levothyroxine (first thing in the morning with water, 30 minutes before eating or taking other medications). - Pt encouraged to double dose the following day if she were to miss a dose given long half-life of levothyroxine. -Free T4 remains within normal range   Medication Continue levothyroxine 88 mcg daily  3.  Secondary adrenal insufficiency:   -Dividing hydrocortisone dose two  thirds in the morning and 1:30 in the afternoon was cumbersome, patient would like to remain on current dose as she has been doing this for years -She is wearing a medical bracelet  Medications : Continue hydrocortisone 10 mg, twice daily   Follow-up in 1 yr  Signed electronically by: Lyndle Herrlich, MD  [facility] Endocrinology  [facility] [address] [place] [place], [place] [address] Phone: [phone] FAX: [phone]   CC: Gweneth Dimitri, MD [address] [place] [address] Phone: [phone] Fax: [phone]   Return to Endocrinology clinic as below: Future Appointments  Date Time Provider Department Center  08/26/2022  1:10 PM Pavneet Markwood, Konrad Dolores, MD [facility] None  09/02/2022  8:00 AM Mort Sawyers, FNP [facility]

## 2022-08-26 NOTE — Patient Instructions (Signed)
Hydrocortisone 10 mg, 1 tablet twice daily  Levothyroxine 88 mcg daily    ADRENAL INSUFFICIENCY SICK DAY RULES:  Should you face an extreme emotional or physical stress such as trauma, surgery or acute illness, this will require extra steroid coverage so that the body can meet that stress.   Without increasing the steroid dose you may experience severe weakness, headache, dizziness, nausea and vomiting and possibly a more serious deterioration in health.  Typically the dose of steroids will only need to be increased for a couple of days if you have an illness that is transient and managed in the community.   If you are unable to take/absorb an increased dose of steroids orally because of vomiting or diarrhea, you will urgently require steroid injections and should present to an Emergency Department.  The general advice for any serious illness is as follows: Double the normal daily steroid dose for up to 3 days if you have a temperature of more than 37.50C (99.24F) with signs of sickness, or severe emotional or physical distress Contact your primary care doctor and Endocrinologist if the illness worsens or it lasts for more than 3 days.  In cases of severe illness, urgent medical assistance should be promptly sought.

## 2022-08-27 MED ORDER — LEVOTHYROXINE SODIUM 88 MCG PO TABS: 88.0000 ug | ORAL_TABLET | Freq: Every day | ORAL | 3 refills | Status: DC

## 2022-09-02 ENCOUNTER — Encounter: Payer: Self-pay | Admitting: Family

## 2022-09-02 ENCOUNTER — Ambulatory Visit: Payer: Managed Care, Other (non HMO) | Admitting: Family

## 2022-09-02 VITALS — BP 126/80 | HR 63 | Temp 98.0°F | Ht 62.0 in | Wt 186.6 lb

## 2022-09-02 DIAGNOSIS — E782 Mixed hyperlipidemia: Secondary | ICD-10-CM | POA: Diagnosis not present

## 2022-09-02 DIAGNOSIS — M722 Plantar fascial fibromatosis: Secondary | ICD-10-CM

## 2022-09-02 DIAGNOSIS — L309 Dermatitis, unspecified: Secondary | ICD-10-CM

## 2022-09-02 DIAGNOSIS — Z23 Encounter for immunization: Secondary | ICD-10-CM

## 2022-09-02 DIAGNOSIS — D649 Anemia, unspecified: Secondary | ICD-10-CM | POA: Diagnosis not present

## 2022-09-02 DIAGNOSIS — E038 Other specified hypothyroidism: Secondary | ICD-10-CM

## 2022-09-02 DIAGNOSIS — L659 Nonscarring hair loss, unspecified: Secondary | ICD-10-CM | POA: Diagnosis not present

## 2022-09-02 DIAGNOSIS — R7303 Prediabetes: Secondary | ICD-10-CM | POA: Diagnosis not present

## 2022-09-02 DIAGNOSIS — R252 Cramp and spasm: Secondary | ICD-10-CM | POA: Insufficient documentation

## 2022-09-02 LAB — CBC WITH DIFFERENTIAL/PLATELET
Basophils Absolute: 0 10*3/uL (ref 0.0–0.1)
Basophils Relative: 0.3 % (ref 0.0–3.0)
Eosinophils Absolute: 0.3 10*3/uL (ref 0.0–0.7)
Eosinophils Relative: 4.1 % (ref 0.0–5.0)
HCT: 40.7 % (ref 36.0–46.0)
Hemoglobin: 13.2 g/dL (ref 12.0–15.0)
Lymphocytes Relative: 51.3 % — ABNORMAL HIGH (ref 12.0–46.0)
Lymphs Abs: 4.1 10*3/uL — ABNORMAL HIGH (ref 0.7–4.0)
MCHC: 32.5 g/dL (ref 30.0–36.0)
MCV: 82 fl (ref 78.0–100.0)
Monocytes Absolute: 0.6 10*3/uL (ref 0.1–1.0)
Monocytes Relative: 7.8 % (ref 3.0–12.0)
Neutro Abs: 2.9 10*3/uL (ref 1.4–7.7)
Neutrophils Relative %: 36.5 % — ABNORMAL LOW (ref 43.0–77.0)
Platelets: 372 10*3/uL (ref 150.0–400.0)
RBC: 4.96 Mil/uL (ref 3.87–5.11)
RDW: 13.5 % (ref 11.5–15.5)
WBC: 7.9 10*3/uL (ref 4.0–10.5)

## 2022-09-02 LAB — LIPID PANEL
Cholesterol: 194 mg/dL (ref 0–200)
HDL: 37.6 mg/dL — ABNORMAL LOW (ref >39.00)
LDL Cholesterol: 137 mg/dL — ABNORMAL HIGH (ref 0–99)
NonHDL: 156.88
Total CHOL/HDL Ratio: 5
Triglycerides: 100 mg/dL (ref 0.0–149.0)
VLDL: 20 mg/dL (ref 0.0–40.0)

## 2022-09-02 LAB — IBC + FERRITIN
Ferritin: 98.3 ng/mL (ref 10.0–291.0)
Iron: 60 ug/dL (ref 42–145)
Saturation Ratios: 24.5 % (ref 20.0–50.0)
TIBC: 245 ug/dL — ABNORMAL LOW (ref 250.0–450.0)
Transferrin: 175 mg/dL — ABNORMAL LOW (ref 212.0–360.0)

## 2022-09-02 LAB — HEMOGLOBIN A1C: Hgb A1c MFr Bld: 5.9 % (ref 4.6–6.5)

## 2022-09-02 NOTE — Patient Instructions (Addendum)
  Can try over the counter magnesium (300 mg) to see if this helps with cramping in the sides.  Work on stretching exercises and make sure you are drinking enough water.   Welcome to our clinic, I am happy to have you as my new patient. I am excited to continue on this healthcare journey with you.  ------------------------------------  Stop by the lab prior to leaving today. I will notify you of your results once received.   Please keep in mind Any my chart messages you send have up to a three business day turnaround for a response.  Phone calls may take up to a one full business day turnaround for a  response.   If you need a medication refill I recommend you request it through the pharmacy as this is easiest for [place] rather than sending a message and or phone call.   Due to recent changes in healthcare laws, you may see results of your imaging and/or laboratory studies on MyChart before I have had a chance to review them.  I understand that in some cases there may be results that are confusing or concerning to you. Please understand that not all results are received at the same time and often I may need to interpret multiple results in order to provide you with the best plan of care or course of treatment. Therefore, I ask that you please give me 2 business days to thoroughly review all your results before contacting my office for clarification. Should we see a critical lab result, you will be contacted sooner.   It was a pleasure seeing you today! Please do not hesitate to reach out with any questions and or concerns.  Regards,   Mort Sawyers FNP-C

## 2022-09-02 NOTE — Assessment & Plan Note (Signed)
Encouraged daily stretching, increasing water intake to goal 90 ounces daily , and also taking otc magnesium 300 mg

## 2022-09-02 NOTE — Assessment & Plan Note (Signed)
Ordering cbc pending results.  Tsh normal range.  Can take biotin otc however did advise her to stop two days prior to getting labs drawn especially for thyroid medication as can alter results.

## 2022-09-02 NOTE — Assessment & Plan Note (Signed)
Stable no recent flares 
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
## 2022-09-02 NOTE — Assessment & Plan Note (Signed)
Stable.  Continue cream prn

## 2022-09-02 NOTE — Assessment & Plan Note (Signed)
Stable. Continue f/u with endo as scheduled.

## 2022-09-02 NOTE — Assessment & Plan Note (Signed)
Cbc ordered pending results.  R/o if this is causing hair loss

## 2022-09-14 ENCOUNTER — Encounter: Payer: Self-pay | Admitting: Family

## 2022-09-24 ENCOUNTER — Encounter: Payer: Self-pay | Admitting: Family

## 2022-09-24 ENCOUNTER — Ambulatory Visit: Payer: Managed Care, Other (non HMO) | Admitting: Internal Medicine

## 2022-09-27 NOTE — Telephone Encounter (Signed)
Wellness form printed and placed in Alexis Wyatt's box in her office. Last physical exam 08/17/21

## 2022-09-27 NOTE — Telephone Encounter (Signed)
Completed and put in office. Please advise pt

## 2022-09-28 NOTE — Telephone Encounter (Signed)
Pt aware form is completed. I mailed a copy per her request and placed one for scanning.

## 2022-12-28 ENCOUNTER — Encounter: Payer: Self-pay | Admitting: Family

## 2022-12-28 DIAGNOSIS — M545 Low back pain, unspecified: Secondary | ICD-10-CM

## 2022-12-28 MED ORDER — NAPROXEN 500 MG PO TABS: 500.0000 mg | ORAL_TABLET | Freq: Two times a day (BID) | ORAL | 0 refills | Status: DC

## 2023-03-09 ENCOUNTER — Other Ambulatory Visit: Payer: Self-pay | Admitting: Family

## 2023-03-09 DIAGNOSIS — L309 Dermatitis, unspecified: Secondary | ICD-10-CM

## 2023-03-28 ENCOUNTER — Encounter: Payer: Self-pay | Admitting: Family

## 2023-03-28 ENCOUNTER — Ambulatory Visit (INDEPENDENT_AMBULATORY_CARE_PROVIDER_SITE_OTHER): Payer: Managed Care, Other (non HMO) | Admitting: Family

## 2023-03-28 VITALS — BP 110/66 | HR 68 | Temp 97.5°F | Ht 62.5 in | Wt 184.4 lb

## 2023-03-28 DIAGNOSIS — E559 Vitamin D deficiency, unspecified: Secondary | ICD-10-CM | POA: Diagnosis not present

## 2023-03-28 DIAGNOSIS — D508 Other iron deficiency anemias: Secondary | ICD-10-CM | POA: Diagnosis not present

## 2023-03-28 DIAGNOSIS — E230 Hypopituitarism: Secondary | ICD-10-CM | POA: Insufficient documentation

## 2023-03-28 DIAGNOSIS — E038 Other specified hypothyroidism: Secondary | ICD-10-CM | POA: Diagnosis not present

## 2023-03-28 DIAGNOSIS — Z780 Asymptomatic menopausal state: Secondary | ICD-10-CM | POA: Diagnosis not present

## 2023-03-28 DIAGNOSIS — Z1211 Encounter for screening for malignant neoplasm of colon: Secondary | ICD-10-CM

## 2023-03-28 DIAGNOSIS — R7303 Prediabetes: Secondary | ICD-10-CM

## 2023-03-28 DIAGNOSIS — E782 Mixed hyperlipidemia: Secondary | ICD-10-CM

## 2023-03-28 LAB — CBC
HCT: 41.7 % (ref 36.0–46.0)
Hemoglobin: 13.2 g/dL (ref 12.0–15.0)
MCHC: 31.7 g/dL (ref 30.0–36.0)
MCV: 84.1 fL (ref 78.0–100.0)
Platelets: 383 10*3/uL (ref 150.0–400.0)
RBC: 4.96 Mil/uL (ref 3.87–5.11)
RDW: 13 % (ref 11.5–15.5)
WBC: 8.5 10*3/uL (ref 4.0–10.5)

## 2023-03-28 LAB — VITAMIN D 25 HYDROXY (VIT D DEFICIENCY, FRACTURES): VITD: 43 ng/mL (ref 30.00–100.00)

## 2023-03-28 LAB — IBC + FERRITIN
Ferritin: 114.3 ng/mL (ref 10.0–291.0)
Iron: 86 ug/dL (ref 42–145)
Saturation Ratios: 34.5 % (ref 20.0–50.0)
TIBC: 249.2 ug/dL — ABNORMAL LOW (ref 250.0–450.0)
Transferrin: 178 mg/dL — ABNORMAL LOW (ref 212.0–360.0)

## 2023-03-28 LAB — LIPID PANEL
Cholesterol: 196 mg/dL (ref 0–200)
HDL: 42.2 mg/dL (ref >39.00)
LDL Cholesterol: 130 mg/dL — ABNORMAL HIGH (ref 0–99)
NonHDL: 153.45
Total CHOL/HDL Ratio: 5
Triglycerides: 116 mg/dL (ref 0.0–149.0)
VLDL: 23.2 mg/dL (ref 0.0–40.0)

## 2023-03-28 LAB — HEMOGLOBIN A1C: Hgb A1c MFr Bld: 5.9 % (ref 4.6–6.5)

## 2023-03-28 NOTE — Progress Notes (Signed)
Subjective:  Patient ID: Alexis Wyatt, female    DOB: [birth year]  Age: 62 y.o. MRN: [id number]  Patient Care Team: Mort Sawyers, FNP as PCP - General (Family Medicine)   CC:  Chief Complaint  Patient presents with  . Annual Exam    HPI Alexis Wyatt is a 62 y.o. female who presents today for an annual physical exam. She reports consuming a general diet. Home exercise routine includes treadmill. She generally feels well. She reports sleeping fairly well. She does not have additional problems to discuss today.   Dental: up to date.  Eye exams: overdue, last two years was last appt.  Mammogram: 01/26/23 bil breast densities , annual screening Last pap: 08/17/21 negative  Colonoscopy:03/2013, having troubles getting records from [facility].  Bone density scan: 2020  Sleep: she states at times she will take some flexeril which helps her to sleep at night time.   Pt is without acute concerns.   Ida: did start an iron supplementation   Hyperlipidemia: not at goal.  Lab Results  Component Value Date   CHOL 194 09/02/2022   HDL 37.60 (L) 09/02/2022   LDLCALC 137 (H) 09/02/2022   TRIG 100.0 09/02/2022   CHOLHDL 5 09/02/2022     Advanced Directives Patient does not have advanced directives   DEPRESSION SCREENING    03/28/2023    8:40 AM 09/02/2022    8:24 AM 08/17/2021    8:58 AM 08/18/2020    8:23 AM 08/13/2019    8:26 AM  PHQ 2/9 Scores  PHQ - 2 Score 0 0 0 0 0  PHQ- 9 Score 2 1        ROS: Negative unless specifically indicated above in HPI.    Current Outpatient Medications:  .  betamethasone dipropionate 0.05 % cream, APPLY TO AFFECTED AREA TWICE A DAY, Disp: 45 g, Rfl: 1 .  cyclobenzaprine (FLEXERIL) 5 MG tablet, Take one po qhs prn muscle spasm, Disp: 90 tablet, Rfl: 1 .  hydrocortisone (CORTEF) 10 MG tablet, Take 1 tablet (10 mg total) by mouth 2 (two) times daily. 1.5 tabs QAM and half a tab between 2-4 pm daily, Disp: 200 tablet, Rfl: 3 .   levothyroxine (SYNTHROID) 88 MCG tablet, Take 1 tablet (88 mcg total) by mouth daily before breakfast., Disp: 90 tablet, Rfl: 3 .  naproxen (NAPROSYN) 500 MG tablet, Take 1 tablet (500 mg total) by mouth 2 (two) times daily with a meal., Disp: 30 tablet, Rfl: 0 .  VITAMIN D, CHOLECALCIFEROL, PO, Take by mouth., Disp: , Rfl:     Objective:    BP 110/66 (BP Location: Left Arm, Patient Position: Sitting, Cuff Size: Normal)   Pulse 68   Temp (!) 97.5 F (36.4 C) (Temporal)   Ht 5' 2.5" (1.588 m)   Wt 184 lb 6.4 oz (83.6 kg)   LMP 04/23/2015 Comment: premenopausal, not preg  SpO2 98%   BMI 33.19 kg/m   BP Readings from Last 3 Encounters:  03/28/23 110/66  09/02/22 126/80  08/26/22 128/78      Physical Exam Constitutional:      General: She is not in acute distress.    Appearance: Normal appearance. She is obese. She is not ill-appearing.  HENT:     Head: Normocephalic.     Right Ear: Tympanic membrane normal.     Left Ear: Tympanic membrane normal.     Nose: Nose normal.     Mouth/Throat:     Mouth: Mucous membranes are  moist.  Eyes:     Extraocular Movements: Extraocular movements intact.     Pupils: Pupils are equal, round, and reactive to light.     Comments: Blue lining around pupil of bil eyes   Cardiovascular:     Rate and Rhythm: Normal rate and regular rhythm.  Pulmonary:     Effort: Pulmonary effort is normal.     Breath sounds: Normal breath sounds.  Abdominal:     General: Abdomen is flat. Bowel sounds are normal.     Palpations: Abdomen is soft.     Tenderness: There is no guarding or rebound.  Musculoskeletal:        General: Normal range of motion.     Cervical back: Normal range of motion.  Skin:    General: Skin is warm.     Capillary Refill: Capillary refill takes less than 2 seconds.  Neurological:     General: No focal deficit present.     Mental Status: She is alert.  Psychiatric:        Mood and Affect: Mood normal.        Behavior: Behavior  normal.        Thought Content: Thought content normal.        Judgment: Judgment normal.         Assessment & Plan:  Panhypopituitarism (HCC) Assessment & Plan: Cont f/u with endo as scheduled.     Screening for colon cancer -     Ambulatory referral to Gastroenterology  Postmenopausal -     DG Bone Density; Future  Acquired central hypothyroidism Assessment & Plan: Cont f/u with endo as scheduled Cont levothyroxine 88 mcg once daily.     Vitamin D deficiency -     VITAMIN D 25 Hydroxy (Vit-D Deficiency, Fractures)  Iron deficiency anemia secondary to inadequate dietary iron intake Assessment & Plan: Ordering cbc ibc ferritin  Pending results.   Orders: -     IBC + Ferritin -     CBC  Mixed hyperlipidemia Assessment & Plan: Ordered lipid panel, pending results. Work on low cholesterol diet and exercise as tolerated   Orders: -     Lipid panel  Prediabetes Assessment & Plan: Pt advised of the following: Work on a diabetic diet, try to incorporate exercise at least 20-30 a day for 3 days a week or more.  A1c ordered pending labs.  Orders: -     Hemoglobin A1c      Follow-up: Return in about 1 year (around 03/27/2024) for f/u CPE.   Mort Sawyers, FNP

## 2023-03-28 NOTE — Assessment & Plan Note (Signed)
Pt advised of the following: Work on a diabetic diet, try to incorporate exercise at least 20-30 a day for 3 days a week or more.  A1c ordered pending labs.

## 2023-03-28 NOTE — Patient Instructions (Addendum)
  A referral was placed today for a colonoscopy  Please let us know if you have not heard back within 2 weeks about the referral.  ------------------------------------  I have sent an electronic order over to your preferred location for the following:   []   2D Mammogram  []   3D Mammogram  [x]   Bone Density   Please give this center a call to get scheduled at your convenience.  [x]   [facility]  [address] [place] [address]  [phone]  Make sure to wear two piece  clothing  No lotions powders or deodorants the day of the appointment Make sure to bring picture ID and insurance card.  Bring list of medications you are currently taking including any supplements.   ------------------------------------  Stop by the lab prior to leaving today. I will notify you of your results once received.   Recommendations on keeping yourself healthy:  - Exercise at least 30-45 minutes a day, 3-4 days a week.  - Eat a low-fat diet with lots of fruits and vegetables, up to 7-9 servings per day.  - Seatbelts can save your life. Wear them always.  - Smoke detectors on every level of your home, check batteries every year.  - Eye Doctor - have an eye exam every 1-2 years  - Safe sex - if you may be exposed to STDs, use a condom.  - Alcohol -  If you drink, do it moderately, less than 2 drinks per day.  - Health Care Power of Attorney. Choose someone to speak for you if you are not able.  - Depression is common in our stressful world.If you're feeling down or losing interest in things you normally enjoy, please come in for a visit.  - Violence - If anyone is threatening or hurting you, please call immediately.  Due to recent changes in healthcare laws, you may see results of your imaging and/or laboratory studies on MyChart before I have had a chance to review them.  I understand that in some cases there may be results that are  confusing or concerning to you. Please understand that not all results are received at the same time and often I may need to interpret multiple results in order to provide you with the best plan of care or course of treatment. Therefore, I ask that you please give me 2 business days to thoroughly review all your results before contacting my office for clarification. Should we see a critical lab result, you will be contacted sooner.   I will see you again in one year for your annual comprehensive exam unless otherwise stated and or with acute concerns.  It was a pleasure seeing you today! Please do not hesitate to reach out with any questions and or concerns.  Regards,   Mort Sawyers

## 2023-03-28 NOTE — Assessment & Plan Note (Signed)
Cont f/u with endo as scheduled Cont levothyroxine 88 mcg once daily.

## 2023-03-28 NOTE — Assessment & Plan Note (Signed)
Cont f/u with endo as scheduled. 

## 2023-03-28 NOTE — Assessment & Plan Note (Signed)
Ordered lipid panel, pending results. Work on low cholesterol diet and exercise as tolerated  

## 2023-03-28 NOTE — Assessment & Plan Note (Signed)
Ordering cbc ibc ferritin Pending results

## 2023-04-04 ENCOUNTER — Encounter: Payer: Self-pay | Admitting: Family

## 2023-04-04 DIAGNOSIS — R7303 Prediabetes: Secondary | ICD-10-CM

## 2023-04-04 DIAGNOSIS — E669 Obesity, unspecified: Secondary | ICD-10-CM

## 2023-04-05 DIAGNOSIS — E669 Obesity, unspecified: Secondary | ICD-10-CM | POA: Insufficient documentation

## 2023-04-05 DIAGNOSIS — Z6832 Body mass index (BMI) 32.0-32.9, adult: Secondary | ICD-10-CM | POA: Insufficient documentation

## 2023-04-05 NOTE — Addendum Note (Signed)
Addended by: Mort Sawyers on: 04/05/2023 02:49 PM   Modules accepted: Orders

## 2023-05-02 ENCOUNTER — Encounter: Payer: Self-pay | Admitting: Family

## 2023-06-07 ENCOUNTER — Ambulatory Visit (AMBULATORY_SURGERY_CENTER): Payer: Managed Care, Other (non HMO)

## 2023-06-07 ENCOUNTER — Encounter: Payer: Self-pay | Admitting: Family

## 2023-06-07 VITALS — Ht 62.5 in | Wt 178.2 lb

## 2023-06-07 DIAGNOSIS — Z1211 Encounter for screening for malignant neoplasm of colon: Secondary | ICD-10-CM

## 2023-06-07 MED ORDER — NA SULFATE-K SULFATE-MG SULF 17.5-3.13-1.6 GM/177ML PO SOLN: 1.0000 | Freq: Once | ORAL | 0 refills | Status: AC

## 2023-06-07 NOTE — Progress Notes (Signed)
 No egg or soy allergy known to patient  No issues known to pt with past sedation with any surgeries or procedures Patient denies ever being told they had issues or difficulty with intubation  No FH of Malignant Hyperthermia Pt is not on diet pills Pt is not on  home 02  Pt is not on blood thinners  Pt denies issues with constipation  No A fib or A flutter Have any cardiac testing pending--no Pt instructed to use Singlecare.com or GoodRx for a price reduction on prep  Ambulates independently Patient's chart reviewed by Cathlyn Parsons CNRA prior to previsit and patient appropriate for the [facility].  Previsit completed and red dot placed by patient's name on their procedure day (on provider's schedule).

## 2023-06-20 ENCOUNTER — Ambulatory Visit (INDEPENDENT_AMBULATORY_CARE_PROVIDER_SITE_OTHER): Payer: Managed Care, Other (non HMO) | Admitting: Family Medicine

## 2023-06-20 ENCOUNTER — Encounter (INDEPENDENT_AMBULATORY_CARE_PROVIDER_SITE_OTHER): Payer: Self-pay | Admitting: Family Medicine

## 2023-06-20 VITALS — BP 120/72 | HR 68 | Temp 98.2°F | Ht 62.0 in | Wt 176.6 lb

## 2023-06-20 DIAGNOSIS — E66811 Obesity, class 1: Secondary | ICD-10-CM | POA: Insufficient documentation

## 2023-06-20 DIAGNOSIS — E782 Mixed hyperlipidemia: Secondary | ICD-10-CM

## 2023-06-20 DIAGNOSIS — Z6832 Body mass index (BMI) 32.0-32.9, adult: Secondary | ICD-10-CM | POA: Insufficient documentation

## 2023-06-20 DIAGNOSIS — E785 Hyperlipidemia, unspecified: Secondary | ICD-10-CM

## 2023-06-20 DIAGNOSIS — E559 Vitamin D deficiency, unspecified: Secondary | ICD-10-CM

## 2023-06-20 DIAGNOSIS — D649 Anemia, unspecified: Secondary | ICD-10-CM

## 2023-06-20 DIAGNOSIS — R7303 Prediabetes: Secondary | ICD-10-CM

## 2023-06-20 DIAGNOSIS — E039 Hypothyroidism, unspecified: Secondary | ICD-10-CM | POA: Diagnosis not present

## 2023-06-20 DIAGNOSIS — E669 Obesity, unspecified: Secondary | ICD-10-CM

## 2023-06-20 NOTE — Progress Notes (Signed)
.smr  Office: [phone]  /  Fax: [phone]  WEIGHT SUMMARY AND BIOMETRICS  Anthropometric Measurements Height: 5\' 2"  (1.575 m) Weight: 176 lb 9.6 oz (80.1 kg) BMI (Calculated): 32.29 Weight at Last Visit: N/A Weight Lost Since Last Visit: N/A Weight Gained Since Last Visit: N/A Starting Weight: N/A Peak Weight: N/A   Body Composition  Body Fat %: 39.8 % Fat Mass (lbs): 70.2 lbs Muscle Mass (lbs): 101 lbs Total Body Water (lbs): 65 lbs Visceral Fat Rating : 11   No data recorded  Chief Complaint: OBESITY  History of Present Illness   The patient is a 63 year old individual with a history of obesity, hypothyroidism, hyperlipidemia, prediabetes, anemia, and vitamin D deficiency. The patient's primary concern is her obesity and the associated health risks. She reports a lifelong struggle with weight, which has worsened since moving to [place] from [place] due to a decrease in physical activity.  The patient had made progress with weight loss prior to the COVID-19 pandemic, losing approximately 15 pounds through increased exercise and participation in [employer]. However, the pandemic and associated lockdowns disrupted this progress. The patient identifies the treadmill as the most effective form of exercise for her, as it allows for a consistent pace that walking does not provide.  The patient's primary goal is to lose weight to avoid the need for medication and to improve overall health. She expresses discomfort with her current weight, particularly the impact of abdominal fat on her mobility and physical comfort. The patient also mentions a desire to be healthier for her grandchildren.  The patient expresses a preference for a weight goal of around 150 pounds, rather than the lower weight suggested by BMI calculations for her height. She expresses concern that a lower weight may not suit her frame and could make her appear unwell.  The patient acknowledges that  the frequency of appointments required for the proposed weight loss program may pose a challenge due to her work commitments. However, she expresses a willingness to consider the program and requests further information to review.          PHYSICAL EXAM:  Blood pressure 120/72, pulse 68, temperature 98.2 F (36.8 C), height 5\' 2"  (1.575 m), weight 176 lb 9.6 oz (80.1 kg), last menstrual period 04/23/2015, SpO2 98%. Body mass index is 32.3 kg/m.  DIAGNOSTIC DATA REVIEWED:  BMET    Component Value Date/Time   NA 139 08/26/2022 1325   NA 140 11/16/2018 0000   K 3.6 08/26/2022 1325   CL 106 08/26/2022 1325   CO2 24 08/26/2022 1325   GLUCOSE 130 (H) 08/26/2022 1325   BUN 15 08/26/2022 1325   BUN 12 11/16/2018 0000   CREATININE 0.92 08/26/2022 1325   CALCIUM 9.5 08/26/2022 1325   Lab Results  Component Value Date   HGBA1C 5.9 03/28/2023   HGBA1C 5.7 06/26/2018   No results found for: "INSULIN" Lab Results  Component Value Date   TSH 0.04 (L) 08/17/2021   CBC    Component Value Date/Time   WBC 8.5 03/28/2023 0923   RBC 4.96 03/28/2023 0923   HGB 13.2 03/28/2023 0923   HCT 41.7 03/28/2023 0923   PLT 383.0 03/28/2023 0923   MCV 84.1 03/28/2023 0923   MCHC 31.7 03/28/2023 0923   RDW 13.0 03/28/2023 0923   Iron Studies    Component Value Date/Time   IRON 86 03/28/2023 0923   TIBC 249.2 (L) 03/28/2023 0923   FERRITIN 114.3 03/28/2023 0923   IRONPCTSAT  34.5 03/28/2023 0923   Lipid Panel     Component Value Date/Time   CHOL 196 03/28/2023 0923   TRIG 116.0 03/28/2023 0923   HDL 42.20 03/28/2023 0923   CHOLHDL 5 03/28/2023 0923   VLDL 23.2 03/28/2023 0923   LDLCALC 130 (H) 03/28/2023 0923   Hepatic Function Panel     Component Value Date/Time   PROT 7.9 08/17/2021 0912   ALBUMIN 4.6 08/17/2021 0912   AST 17 08/17/2021 0912   ALT 23 08/17/2021 0912   ALKPHOS 66 08/17/2021 0912   BILITOT 0.8 08/17/2021 0912      Component Value Date/Time   TSH 0.04 (L)  08/17/2021 0912   Nutritional Lab Results  Component Value Date   VD25OH 43.00 03/28/2023   VD25OH 22.72 (L) 08/17/2021     Assessment and Plan    Obesity Chronic condition exacerbated by lifestyle changes post-move to [place]. Previous weight loss attempts through exercise and diet were partially successful. Primary goal: weight loss to avoid medication, reduce back pain, and increase mobility. Discussed genetic components and mechanisms resisting weight loss. Emphasized comprehensive workup to assess metabolic rate and contributing factors. Explained long-term program with frequent initial visits. Informed about $99 program fee and insurance coverage. - Schedule comprehensive workup to assess metabolism and contributing factors. - Perform extensive lab testing to identify metabolic rate and relevant health markers. - Develop personalized eating plan based on lab results and patient preferences. - Initiate customized exercise regimen focusing on safe, effective methods to increase activity. - Schedule follow-up visits every two weeks for the first six visits to monitor progress and adjust as needed.  Prediabetes Prediabetes complicates weight loss and increases diabetes risk. Managing prediabetes is crucial to prevent diabetes and support weight loss. - Include prediabetes management in comprehensive workup. - Monitor blood glucose and relevant markers during lab testing. - Incorporate dietary and lifestyle modifications to improve insulin sensitivity.  Hyperlipidemia Hyperlipidemia influenced by weight and diet. Aim to improve cholesterol levels through weight loss and lifestyle changes to reduce or avoid medication. - Monitor lipid profile during lab testing. - Incorporate dietary recommendations to improve lipid levels in personalized eating plan.  Hypothyroidism Hypothyroidism affects metabolism and weight. Ensuring well-managed thyroid function is essential for effective  weight management. - Include thyroid function tests in comprehensive workup. - Adjust thyroid medication based on lab results if necessary.  Anemia Anemia affects energy levels and overall health. Proper management supports weight loss efforts. - Include CBC and iron studies in lab testing. - Address any deficiencies identified in lab results.  Vitamin D Deficiency Vitamin D deficiency impacts overall health and may support weight loss efforts. Adequate levels are important. - Include vitamin D levels in lab testing. - Recommend vitamin D supplementation if levels are low.  Follow-up - Schedule follow-up visits every two weeks for the first six visits. - Adjust visit frequency based on progress, potentially extending to every three to four weeks during active weight loss phase. - Plan long-term follow-up every three months once weight loss goal is reached.         I have personally spent 35 minutes total time today in preparation, patient care, and documentation for this visit, including the following: review of clinical lab tests; review of medical tests/procedures/services.    She was informed of the importance of frequent follow up visits to maximize her success with intensive lifestyle modifications for her multiple health conditions.    Quillian Quince, MD

## 2023-06-23 ENCOUNTER — Encounter: Payer: Self-pay | Admitting: Internal Medicine

## 2023-07-01 ENCOUNTER — Ambulatory Visit (AMBULATORY_SURGERY_CENTER): Payer: Managed Care, Other (non HMO) | Admitting: Internal Medicine

## 2023-07-01 ENCOUNTER — Encounter: Payer: Self-pay | Admitting: Internal Medicine

## 2023-07-01 VITALS — BP 110/86 | HR 64 | Temp 98.1°F | Resp 14 | Ht 62.5 in | Wt 178.0 lb

## 2023-07-01 DIAGNOSIS — Z1211 Encounter for screening for malignant neoplasm of colon: Secondary | ICD-10-CM | POA: Diagnosis present

## 2023-07-01 DIAGNOSIS — D124 Benign neoplasm of descending colon: Secondary | ICD-10-CM | POA: Diagnosis not present

## 2023-07-01 DIAGNOSIS — K5730 Diverticulosis of large intestine without perforation or abscess without bleeding: Secondary | ICD-10-CM | POA: Diagnosis not present

## 2023-07-01 MED ORDER — SODIUM CHLORIDE 0.9 % IV SOLN: 500.0000 mL | Freq: Once | INTRAVENOUS | Status: AC

## 2023-07-01 NOTE — Progress Notes (Signed)
 Pt's states no medical or surgical changes since previsit or office visit.

## 2023-07-01 NOTE — Progress Notes (Signed)
 GASTROENTEROLOGY PROCEDURE H&P NOTE   Primary Care Physician: Corwin Antu, FNP    Reason for Procedure:  Colon cancer screening  Plan:    Colonoscopy  Patient is appropriate for endoscopic procedure(s) in the ambulatory ([facility]) setting.  The nature of the procedure, as well as the risks, benefits, and alternatives were carefully and thoroughly reviewed with the patient. Ample time for discussion and questions allowed. The patient understood, was satisfied, and agreed to proceed.     HPI: Alexis Wyatt is a 63 y.o. female who presents for screening colonoscopy.  Medical history as below.  Tolerated the prep.  No recent chest pain or shortness of breath.  No abdominal pain today.  Past Medical History:  Diagnosis Date   Thyroid  disease     Past Surgical History:  Procedure Laterality Date   COLONOSCOPY     CYST removal wrist Bilateral    Gallstone Removal     PITUITARY SURGERY      Prior to Admission medications   Medication Sig Start Date End Date Taking? Authorizing Provider  betamethasone  dipropionate 0.05 % cream APPLY TO AFFECTED AREA TWICE A DAY 03/10/23   Corwin Antu, FNP  cyclobenzaprine  (FLEXERIL ) 5 MG tablet Take one po qhs prn muscle spasm 07/19/22   Dugal, Tabitha, FNP  hydrocortisone  (CORTEF ) 10 MG tablet Take 1 tablet (10 mg total) by mouth 2 (two) times daily. 1.5 tabs QAM and half a tab between 2-4 pm daily 08/26/22   Shamleffer, Ibtehal Jaralla, MD  levothyroxine  (SYNTHROID ) 88 MCG tablet Take 1 tablet (88 mcg total) by mouth daily before breakfast. 08/27/22   Shamleffer, Ibtehal Jaralla, MD  naproxen  (NAPROSYN ) 500 MG tablet Take 1 tablet (500 mg total) by mouth 2 (two) times daily with a meal. 12/28/22   Dugal, Tabitha, FNP  VITAMIN D , CHOLECALCIFEROL, PO Take by mouth.    [provider]    Current Outpatient Medications  Medication Sig Dispense Refill   betamethasone  dipropionate 0.05 % cream APPLY TO AFFECTED AREA TWICE A DAY 45 g 1    cyclobenzaprine  (FLEXERIL ) 5 MG tablet Take one po qhs prn muscle spasm 90 tablet 1   hydrocortisone  (CORTEF ) 10 MG tablet Take 1 tablet (10 mg total) by mouth 2 (two) times daily. 1.5 tabs QAM and half a tab between 2-4 pm daily 200 tablet 3   levothyroxine  (SYNTHROID ) 88 MCG tablet Take 1 tablet (88 mcg total) by mouth daily before breakfast. 90 tablet 3   naproxen  (NAPROSYN ) 500 MG tablet Take 1 tablet (500 mg total) by mouth 2 (two) times daily with a meal. 30 tablet 0   VITAMIN D , CHOLECALCIFEROL, PO Take by mouth.     Current Facility-Administered Medications  Medication Dose Route Frequency Provider Last Rate Last Admin   0.9 %  sodium chloride  infusion  500 mL Intravenous Once Maggi Hershkowitz, Gordy HERO, MD        Allergies as of 07/01/2023   (No Known Allergies)    Family History  Problem Relation Age of Onset   Dementia Mother    Other Mother        protein deficiency-not sure the type   Stroke Mother 41       mild   Stroke Father 4   Breast cancer Sister 28   Dementia Sister    HIV/AIDS Sister    Bronchitis Sister    Gout Maternal Grandmother    Other Maternal Grandmother        heart issues but not sure  of the specifics   Heart attack Nephew 40       x2   Colon cancer Neg Hx    Esophageal cancer Neg Hx    Rectal cancer Neg Hx    Stomach cancer Neg Hx    Colon polyps Neg Hx     Social History   Socioeconomic History   Marital status: Single    Spouse name: Not on file   Number of children: 1   Years of education: Master's Degree   Highest education level: Not on file  Occupational History    Employer: [employer]  Tobacco Use   Smoking status: Never   Smokeless tobacco: Never  Vaping Use   Vaping status: Never Used  Substance and Sexual Activity   Alcohol use: Yes    Comment: wine once a week-other alcohol about once to twice a month   Drug use: No   Sexual activity: Yes    Partners: Male    Birth control/protection: Post-menopausal  Other Topics  Concern   Not on file  Social History Narrative   08/17/21   From: [place], [place] originally, moved to be near family   Living: alone currently   Work: education administrator in business administration, works with the government      Family: Laneta (daughter) - grandson (2023)      Enjoys: outdoor activities      Exercise: trying to walk in the neighborhood 3 times a week   Diet: not great      Safety   Seat belts: Yes    Guns: No   Safe in relationships: Yes       'dollie'   Social Drivers of Corporate Investment Banker Strain: Low Risk  (09/02/2020)   Received from [facility], [facility]   Overall Financial Resource Strain (CARDIA)    Difficulty of Paying Living Expenses: Not hard at all  Food Insecurity: No Food Insecurity (09/02/2020)   Received from [facility], [facility]   Hunger Vital Sign    Worried About Running Out of Food in the Last Year: Never true    Ran Out of Food in the Last Year: Never true  Transportation Needs: No Transportation Needs (09/02/2020)   Received from [facility]   PRAPARE - Transportation  Physical Activity: Not on file  Stress: Not on file  Social Connections: Unknown (10/06/2021)   Received from [facility], [facility]   Social Network    Social Network: Not on file  Intimate Partner Violence: Unknown (08/27/2021)   Received from [facility], [facility]   HITS    Physically Hurt: Not on file    Insult or Talk Down To: Not on file    Threaten Physical Harm: Not on file    Scream or Curse: Not on file    Physical Exam: Vital signs in last 24 hours: @BP  124/72   Pulse 92   Temp 98.1 F (36.7 C) (Temporal)   Ht 5' 2.5 (1.588 m)   Wt 178 lb (80.7 kg)   LMP 04/23/2015 Comment: premenopausal, not preg  SpO2 97%   BMI 32.04 kg/m  GEN: NAD EYE: Sclerae anicteric ENT: MMM CV: Non-tachycardic Pulm: CTA b/l GI: Soft, NT/ND NEURO:  Alert & Oriented  x 3   Gordy Starch, MD [facility]  07/01/2023 9:29 AM

## 2023-07-01 NOTE — Progress Notes (Signed)
 Sedate, gd SR, tolerated procedure well, VSS, report to RN

## 2023-07-01 NOTE — Patient Instructions (Addendum)
 YOU HAD AN ENDOSCOPIC PROCEDURE TODAY AT THE [facility]:   Refer to the procedure report that was given to you for any specific questions about what was found during the examination.  If the procedure report does not answer your questions, please call your gastroenterologist to clarify.  If you requested that your care partner not be given the details of your procedure findings, then the procedure report has been included in a sealed envelope for you to review at your convenience later.  YOU SHOULD EXPECT: Some feelings of bloating in the abdomen. Passage of more gas than usual.  Walking can help get rid of the air that was put into your GI tract during the procedure and reduce the bloating. If you had a lower endoscopy (such as a colonoscopy or flexible sigmoidoscopy) you may notice spotting of blood in your stool or on the toilet paper. If you underwent a bowel prep for your procedure, you may not have a normal bowel movement for a few days.  Please Note:  You might notice some irritation and congestion in your nose or some drainage.  This is from the oxygen used during your procedure.  There is no need for concern and it should clear up in a day or so.  SYMPTOMS TO REPORT IMMEDIATELY:   Following lower endoscopy (colonoscopy or flexible sigmoidoscopy):  Excessive amounts of blood in the stool  Significant tenderness or worsening of abdominal pains  Swelling of the abdomen that is new, acute  Fever of 100F or higher   For urgent or emergent issues, a gastroenterologist can be reached at any hour by calling [phone]. Do not use MyChart messaging for urgent concerns.    DIET:  We do recommend a small meal at first, but then you may proceed to your regular diet.  Drink plenty of fluids but you should avoid alcoholic beverages for 24 hours.  MEDICATIONS: Continue present medications.  Please see handouts given to you by your recovery nurse: Polyps, Diverticulosis,  FOLLOW  UP: Await pathology results. Repeat colonoscopy is recommended. The colonoscopy date will be determined after pathology results from today's exam become available for review.  Thank you for allowing us  to provide for your healthcare needs today.  ACTIVITY:  You should plan to take it easy for the rest of today and you should NOT DRIVE or use heavy machinery until tomorrow (because of the sedation medicines used during the test).    FOLLOW UP: Our staff will call the number listed on your records the next business day following your procedure.  We will call around 7:15- 8:00 am to check on you and address any questions or concerns that you may have regarding the information given to you following your procedure. If we do not reach you, we will leave a message.     If any biopsies were taken you will be contacted by phone or by letter within the next 1-3 weeks.  Please call us  at [phone] if you have not heard about the biopsies in 3 weeks.    SIGNATURES/CONFIDENTIALITY: You and/or your care partner have signed paperwork which will be entered into your electronic medical record.  These signatures attest to the fact that that the information above on your After Visit Summary has been reviewed and is understood.  Full responsibility of the confidentiality of this discharge information lies with you and/or your care-partner.

## 2023-07-01 NOTE — Op Note (Signed)
 [facility] Patient Name: Alexis Wyatt Procedure Date: 07/01/2023 9:26 AM MRN: [id number] Endoscopist: Gordy CHRISTELLA Starch , MD, [id number] Age: 63 Referring MD:  Date of Birth: 1961-03-31 Gender: Female Account #: [id number] Procedure:                Colonoscopy Indications:              Screening for colorectal malignant neoplasm, Last                            colonoscopy 10 years ago Medicines:                Monitored Anesthesia Care Procedure:                Pre-Anesthesia Assessment:                           - Prior to the procedure, a History and Physical                            was performed, and patient medications and                            allergies were reviewed. The patient's tolerance of                            previous anesthesia was also reviewed. The risks                            and benefits of the procedure and the sedation                            options and risks were discussed with the patient.                            All questions were answered, and informed consent                            was obtained. Prior Anticoagulants: The patient has                            taken no anticoagulant or antiplatelet agents. ASA                            Grade Assessment: II - A patient with mild systemic                            disease. After reviewing the risks and benefits,                            the patient was deemed in satisfactory condition to                            undergo the procedure.  After obtaining informed consent, the colonoscope                            was passed under direct vision. Throughout the                            procedure, the patient's blood pressure, pulse, and                            oxygen saturations were monitored continuously. The                            Olympus CF-HQ190L ([id number]) Colonoscope was                            introduced through the anus and advanced  to the                            cecum, identified by appendiceal orifice and                            ileocecal valve. The colonoscopy was performed                            without difficulty. The patient tolerated the                            procedure well. The quality of the bowel                            preparation was good. The ileocecal valve,                            appendiceal orifice, and rectum were photographed. Scope In: 10:06:30 AM Scope Out: 10:17:14 AM Scope Withdrawal Time: 0 hours 8 minutes 19 seconds  Total Procedure Duration: 0 hours 10 minutes 44 seconds  Findings:                 The perianal and digital rectal examinations were                            normal.                           A 3 mm polyp was found in the descending colon. The                            polyp was sessile. The polyp was removed with a                            cold snare. Resection and retrieval were complete.                           A few small-mouthed diverticula were found in the  sigmoid colon.                           The retroflexed view of the distal rectum and anal                            verge was normal and showed no anal or rectal                            abnormalities. Complications:            No immediate complications. Estimated Blood Loss:     Estimated blood loss: none. Impression:               - One 3 mm polyp in the descending colon, removed                            with a cold snare. Resected and retrieved.                           - Diverticulosis in the sigmoid colon.                           - The distal rectum and anal verge are normal on                            retroflexion view. Recommendation:           - Patient has a contact number available for                            emergencies. The signs and symptoms of potential                            delayed complications were discussed with the                             patient. Return to normal activities tomorrow.                            Written discharge instructions were provided to the                            patient.                           - Resume previous diet.                           - Continue present medications.                           - Await pathology results.                           - Repeat colonoscopy is recommended. The  colonoscopy date will be determined after pathology                            results from today's exam become available for                            review. Gordy CHRISTELLA Starch, MD 07/01/2023 10:21:25 AM This report has been signed electronically.

## 2023-07-01 NOTE — Progress Notes (Signed)
 Called to room to assist during endoscopic procedure.  Patient ID and intended procedure confirmed with present staff. Received instructions for my participation in the procedure from the performing physician.

## 2023-07-04 ENCOUNTER — Telehealth: Payer: Self-pay | Admitting: *Deleted

## 2023-07-04 NOTE — Telephone Encounter (Signed)
  Follow up Call-     07/01/2023    9:19 AM  Call back number  Post procedure Call Back phone  # [phone]  Permission to leave phone message Yes     Patient questions:   Message left to call if necessary.

## 2023-07-05 LAB — SURGICAL PATHOLOGY

## 2023-07-06 ENCOUNTER — Encounter: Payer: Self-pay | Admitting: Internal Medicine

## 2023-07-07 ENCOUNTER — Encounter (INDEPENDENT_AMBULATORY_CARE_PROVIDER_SITE_OTHER): Payer: Self-pay

## 2023-08-11 ENCOUNTER — Ambulatory Visit
Admission: RE | Admit: 2023-08-11 | Discharge: 2023-08-11 | Disposition: A | Discharge status: Home / Self Care | Payer: Managed Care, Other (non HMO) | Source: Ambulatory Visit | Attending: Family | Admitting: Family

## 2023-08-11 DIAGNOSIS — Z780 Asymptomatic menopausal state: Secondary | ICD-10-CM | POA: Diagnosis present

## 2023-08-11 DIAGNOSIS — Z13820 Encounter for screening for osteoporosis: Secondary | ICD-10-CM | POA: Insufficient documentation

## 2023-08-15 ENCOUNTER — Encounter: Payer: Self-pay | Admitting: Family

## 2023-08-25 NOTE — Progress Notes (Signed)
 Name: Alexis Wyatt  MRN/ DOB: [id number], 02-Feb-1961    Age/ Sex: 63 y.o., female    PCP: Mort Sawyers, FNP   Reason for Endocrinology Evaluation: Hx pituitary adenoma     Date of Initial Endocrinology Evaluation: 05/03/2022    HPI: Alexis Wyatt is a 63 y.o. female with a past medical history of depression, dyslipidemia,Hx of pituitary adenoma. The patient presented for initial endocrinology clinic visit on 05/03/2022 for consultative assistance with her Hx of pituitary adenoma.   She was noted with pituitary adenoma in 2010, through brain MRI 1.2 x 0.8 cm.  She is not aware of any hormonal problems because of the adenoma.  She went through menopause in 2015  She is s/p resection and 03/2009 at [facility].  Pathology was consistent with pituitary adenoma, no other details.  Migraines resolved postoperatively.  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  No evidence of DI  She was seen by a local endocrinologist in [place] from 2020-2012  MRI in 2013 did not show any recurrence of adenoma MRI 2018 was unremarkable   She started following up with [facility] in 2017 Saw Dr. Tedd Sias in 2018 and no further MRIs were recommended  She was seen by [facility] endocrinology for hypothyroidism on April 12th,2022  No Fh of thyroid or pituitary issues   On her initial visit with me she was on Levothyroxine and hydrocortisone     Her ACTH was undetectable at 5 pg/mL , normal but low end of normal IGF-1 as well as low prolactin  I have attempted to divide her hydrocortisone dose to two thirds in the morning and one third in the afternoon but cutting the tablet was cumbersome and she opted to stay on twice daily dosing   Switch hydrocortisone to prednisone 08/2023  SUBJECTIVE:    Today (08/26/23):  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  Has noted occasional headache that she attributes to stress  Denies local neck swelling  Denies palpitations  Denies tremors   Denies dizziness  Has noted hair thinning Has occasional sleeping difficulty  No Biotin  She has a medical alert bracelet     Hydrocortisone 10 mg, 1.5 tabs every morning and half a tablet between 2-4 PM daily- She takes BID  Levothyroxine 88 mcg daily     HISTORY:  Past Medical History:  Past Medical History:  Diagnosis Date   Thyroid disease    Past Surgical History:  Past Surgical History:  Procedure Laterality Date   COLONOSCOPY     CYST removal wrist Bilateral    Gallstone Removal     PITUITARY SURGERY      Social History:  reports that she has never smoked. She has never used smokeless tobacco. She reports current alcohol use. She reports that she does not use drugs. Family History: family history includes Breast cancer (age of onset: 33) in her sister; Bronchitis in her sister; Dementia in her mother and sister; Gout in her maternal grandmother; HIV/AIDS in her sister; Heart attack (age of onset: 107) in her nephew; Other in her maternal grandmother and mother; Stroke (age of onset: 5) in her father; Stroke (age of onset: 87) in her mother.   HOME MEDICATIONS: Allergies as of 08/26/2023   No Known Allergies      Medication List        Accurate as of August 26, 2023  7:40 AM. If you have any questions, ask your nurse or doctor.          betamethasone dipropionate 0.05 % cream APPLY TO AFFECTED AREA TWICE A DAY   cyclobenzaprine 5 MG tablet Commonly known as: FLEXERIL Take one po qhs prn muscle spasm   ferrous sulfate 325 (65 FE) MG EC tablet Take 325 mg by mouth daily with breakfast.   hydrocortisone 10 MG tablet Commonly known as: CORTEF Take 1 tablet (10 mg total) by mouth 2 (two) times daily. 1.5 tabs QAM and half a tab between 2-4 pm daily   levothyroxine 88 MCG tablet Commonly known as:  SYNTHROID Take 1 tablet (88 mcg total) by mouth daily before breakfast.   naproxen 500 MG tablet Commonly known as: Naprosyn Take 1 tablet (500 mg total) by mouth 2 (two) times daily with a meal.   VITAMIN D (CHOLECALCIFEROL) PO Take by mouth.          REVIEW OF SYSTEMS: A comprehensive ROS was conducted with the patient and is negative except as per HPI    OBJECTIVE:  VS: BP 120/80 (BP Location: Left Arm, Patient Position: Sitting, Cuff Size: Small)   Pulse 85   Ht 5' 2.5" (1.588 m)   Wt 179 lb (81.2 kg)   LMP 04/23/2015 Comment: premenopausal, not preg  SpO2 98%   BMI 32.22 kg/m    Wt Readings from Last 3 Encounters:  08/26/23 179 lb (81.2 kg)  07/01/23 178 lb (80.7 kg)  06/20/23 176 lb 9.6 oz (80.1 kg)   Body surface area is 1.89 meters squared.   EXAM: General: Pt appears well and is in NAD  Neck: General: Supple without adenopathy. Thyroid: Thyroid size normal.  No goiter or nodules appreciated.  Lungs: Clear with good BS bilat   Heart: Auscultation: RRR.  Abdomen: Normoactive bowel sounds, soft, nontender  Extremities:  BL LE: No pretibial edema   Mental Status: Judgment, insight: Intact Orientation: Oriented to time, place, and person Mood and affect: No depression, anxiety, or agitation     DATA REVIEWED:  Latest Reference Range & Units 08/26/23 08:05  Sodium 135 - 146 mmol/L 139  Potassium 3.5 - 5.3 mmol/L 4.0  Chloride 98 - 110 mmol/L 106  CO2 20 - 32 mmol/L 27  Glucose 65 - 99 mg/dL 86  BUN 7 - 25 mg/dL 15  Creatinine 1.61 - 0.96 mg/dL 0.45  Calcium 8.6 - 40.9 mg/dL 9.2  BUN/Creatinine Ratio 6 - 22 (calc) SEE NOTE:  eGFR > OR = 60 mL/min/1.34m2 81    Latest Reference Range & Units 08/26/23 08:05  T4,Free(Direct) 0.8 - 1.8 ng/dL 1.1    ASSESSMENT/PLAN/RECOMMENDATIONS:   Hx pituitary macroadenoma:   -S/p transsphenoidal pituitary resection in 2010 with panhypopituitarism given low ACTH, TSH, and prolactin -Serial MRIs postoperatively  were nonrevealing and no further imaging was recommended in 2018, unless of course clinical concerns arise -No local symptoms -BMP is normal    2.  Secondary hypothyroidism  -Patient is clinically euthyroid  -Free T4 normal -No change   Medication Continue levothyroxine 88 mcg daily  3.  Secondary adrenal insufficiency:   -Dividing hydrocortisone dose two thirds in the morning and 1:30 in the afternoon was cumbersome and she was taking it 1 tab BID, we have opted to switch to prednisone -Discussed sick day will -She is wearing a medical bracelet  Medications : Stop hydrocortisone 10 mg, twice daily Start prednisone 5 mg daily   Follow-up in 1 yr  Signed electronically by: Lyndle Herrlich, MD  [facility] Endocrinology  [facility] [address] [place] [place], [place] [address] Phone: [phone] FAX: [phone]   CC: Mort Sawyers, FNP [address] [place] [address] Phone: [phone] Fax: [phone]   Return to Endocrinology clinic as below: Future Appointments  Date Time Provider Department Center  04/02/2024  8:40 AM Mort Sawyers, FNP [facility]

## 2023-08-26 ENCOUNTER — Ambulatory Visit: Payer: Managed Care, Other (non HMO) | Admitting: Internal Medicine

## 2023-08-26 ENCOUNTER — Encounter: Payer: Self-pay | Admitting: Internal Medicine

## 2023-08-26 VITALS — BP 120/80 | HR 85 | Ht 62.5 in | Wt 179.0 lb

## 2023-08-26 DIAGNOSIS — E893 Postprocedural hypopituitarism: Secondary | ICD-10-CM

## 2023-08-26 DIAGNOSIS — E2749 Other adrenocortical insufficiency: Secondary | ICD-10-CM

## 2023-08-26 DIAGNOSIS — E038 Other specified hypothyroidism: Secondary | ICD-10-CM

## 2023-08-26 LAB — BASIC METABOLIC PANEL WITH GFR
BUN: 15 mg/dL (ref 7–25)
CO2: 27 mmol/L (ref 20–32)
Calcium: 9.2 mg/dL (ref 8.6–10.4)
Chloride: 106 mmol/L (ref 98–110)
Creat: 0.82 mg/dL (ref 0.50–1.05)
Glucose, Bld: 86 mg/dL (ref 65–99)
Potassium: 4 mmol/L (ref 3.5–5.3)
Sodium: 139 mmol/L (ref 135–146)
eGFR: 81 mL/min/{1.73_m2} (ref >=60)

## 2023-08-26 LAB — T4, FREE: Free T4: 1.1 ng/dL (ref 0.8–1.8)

## 2023-08-26 MED ORDER — PREDNISONE 5 MG PO TABS: 5.0000 mg | ORAL_TABLET | Freq: Every day | ORAL | 3 refills | Status: AC

## 2023-08-26 NOTE — Patient Instructions (Signed)
 Switch Hydrocortisone to prednisone 5 mg, 1 tablet with breakfast  Levothyroxine 88 mcg daily    ADRENAL INSUFFICIENCY SICK DAY RULES:  Should you face an extreme emotional or physical stress such as trauma, surgery or acute illness, this will require extra steroid coverage so that the body can meet that stress.   Without increasing the steroid dose you may experience severe weakness, headache, dizziness, nausea and vomiting and possibly a more serious deterioration in health.  Typically the dose of steroids will only need to be increased for a couple of days if you have an illness that is transient and managed in the community.   If you are unable to take/absorb an increased dose of steroids orally because of vomiting or diarrhea, you will urgently require steroid injections and should present to an Emergency Department.  The general advice for any serious illness is as follows: Double the normal daily steroid dose for up to 3 days if you have a temperature of more than 37.50C (99.51F) with signs of sickness, or severe emotional or physical distress Contact your primary care doctor and Endocrinologist if the illness worsens or it lasts for more than 3 days.  In cases of severe illness, urgent medical assistance should be promptly sought.

## 2023-08-29 ENCOUNTER — Encounter: Payer: Self-pay | Admitting: Internal Medicine

## 2023-11-06 ENCOUNTER — Other Ambulatory Visit: Payer: Self-pay | Admitting: Internal Medicine

## 2023-11-06 DIAGNOSIS — E038 Other specified hypothyroidism: Secondary | ICD-10-CM

## 2023-11-06 DIAGNOSIS — E2740 Unspecified adrenocortical insufficiency: Secondary | ICD-10-CM

## 2023-11-06 DIAGNOSIS — D352 Benign neoplasm of pituitary gland: Secondary | ICD-10-CM

## 2024-01-26 LAB — HM MAMMOGRAPHY

## 2024-01-30 ENCOUNTER — Encounter: Payer: Self-pay | Admitting: Family

## 2024-01-31 ENCOUNTER — Ambulatory Visit: Payer: Self-pay | Admitting: Family

## 2024-04-02 ENCOUNTER — Ambulatory Visit: Payer: Self-pay | Admitting: Family

## 2024-04-02 ENCOUNTER — Ambulatory Visit: Payer: Managed Care, Other (non HMO) | Admitting: Family

## 2024-04-02 ENCOUNTER — Encounter: Payer: Self-pay | Admitting: Family

## 2024-04-02 VITALS — BP 116/84 | HR 80 | Temp 98.3°F | Ht 62.5 in | Wt 181.2 lb

## 2024-04-02 DIAGNOSIS — D649 Anemia, unspecified: Secondary | ICD-10-CM | POA: Diagnosis not present

## 2024-04-02 DIAGNOSIS — E782 Mixed hyperlipidemia: Secondary | ICD-10-CM | POA: Diagnosis not present

## 2024-04-02 DIAGNOSIS — Z0001 Encounter for general adult medical examination with abnormal findings: Secondary | ICD-10-CM

## 2024-04-02 DIAGNOSIS — R7303 Prediabetes: Secondary | ICD-10-CM | POA: Diagnosis not present

## 2024-04-02 LAB — BASIC METABOLIC PANEL WITH GFR
BUN: 16 mg/dL (ref 6–23)
CO2: 27 meq/L (ref 19–32)
Calcium: 9 mg/dL (ref 8.4–10.5)
Chloride: 105 meq/L (ref 96–112)
Creatinine, Ser: 0.83 mg/dL (ref 0.40–1.20)
GFR: 75.6 mL/min (ref >60.00)
Glucose, Bld: 85 mg/dL (ref 70–99)
Potassium: 3.2 meq/L — ABNORMAL LOW (ref 3.5–5.1)
Sodium: 140 meq/L (ref 135–145)

## 2024-04-02 LAB — LIPID PANEL
Cholesterol: 197 mg/dL (ref 0–200)
HDL: 43.5 mg/dL (ref >39.00)
LDL Cholesterol: 133 mg/dL — ABNORMAL HIGH (ref 0–99)
NonHDL: 153.6
Total CHOL/HDL Ratio: 5
Triglycerides: 104 mg/dL (ref 0.0–149.0)
VLDL: 20.8 mg/dL (ref 0.0–40.0)

## 2024-04-02 LAB — HEMOGLOBIN A1C: Hgb A1c MFr Bld: 5.9 % (ref 4.6–6.5)

## 2024-04-02 NOTE — Progress Notes (Signed)
 Subjective:  Patient ID: Alexis Wyatt, female    DOB: June 06, 1960  Age: 63 y.o. MRN: [id number]  Patient Care Team: Corwin Antu, FNP as PCP - General (Family Medicine)   CC:  Chief Complaint  Patient presents with   Annual Exam    HPI Alexis Wyatt is a 63 y.o. female who presents today for an annual physical exam. She reports consuming a general diet. The patient does not participate in regular exercise at present. She generally feels well. She reports sleeping fairly well. She does not have additional problems to discuss today.   Vision:Within last year Dental:No regular dental care  Mammogram: 01/26/24  Last pap: 08/17/21 negative Colonoscopy: 07/01/23  Pt is with acute concerns.   Discussed the use of AI scribe software for clinical note transcription with the patient, who gave verbal consent to proceed.  History of Present Illness Jacoby Jetta Rolf is a 63 year old female who presents with intermittent left leg pain and sciatica symptoms.  She experiences intermittent pain in her left leg, described as a 'tired' feeling with occasional pain in the upper thigh area. This sensation prompts her to rest the leg by elevating it, and she finds relief by applying rubbing alcohol to the area. The episodes are infrequent, occurring perhaps once this year, and she has not identified a specific trigger.  In September, she experienced pain under her lower buttocks that radiated down the back of her leg. This was particularly uncomfortable during a vacation when she was walking a lot. No associated low back pain or tingling sensations. She has not been taking any regular medication for this but has some muscle relaxers left from a previous prescription.  She does not engage in regular exercise but has considered starting a routine. She sleeps variably, depending on the day, and has no history of smoking.   Advanced Directives Patient does not have advanced directives    DEPRESSION SCREENING    04/02/2024    8:29 AM 03/28/2023    8:40 AM 09/02/2022    8:24 AM 08/17/2021    8:58 AM 08/18/2020    8:23 AM 08/13/2019    8:26 AM  PHQ 2/9 Scores  PHQ - 2 Score 1 0 0 0 0 0  PHQ- 9 Score 3 2  1          Data saved with a previous flowsheet row definition     ROS: Negative unless specifically indicated above in HPI.    Current Outpatient Medications:    betamethasone  dipropionate 0.05 % cream, APPLY TO AFFECTED AREA TWICE A DAY, Disp: 45 g, Rfl: 1   cyclobenzaprine  (FLEXERIL ) 5 MG tablet, Take one po qhs prn muscle spasm, Disp: 90 tablet, Rfl: 1   ferrous sulfate 325 (65 FE) MG EC tablet, Take 325 mg by mouth daily with breakfast., Disp: , Rfl:    levothyroxine  (SYNTHROID ) 88 MCG tablet, TAKE 1 TABLET BY MOUTH DAILY BEFORE BREAKFAST., Disp: 90 tablet, Rfl: 3   naproxen  (NAPROSYN ) 500 MG tablet, Take 1 tablet (500 mg total) by mouth 2 (two) times daily with a meal., Disp: 30 tablet, Rfl: 0   predniSONE  (DELTASONE ) 5 MG tablet, Take 1 tablet (5 mg total) by mouth daily with breakfast. To replace hydrocortisone . Pt to double up during sick days, Disp: 100 tablet, Rfl: 3   VITAMIN D , CHOLECALCIFEROL, PO, Take by mouth., Disp: , Rfl:   Current Facility-Administered Medications:    0.9 %  sodium chloride  infusion, 500 mL, Intravenous,  Once, Pyrtle, Gordy HERO, MD    Objective:    BP 116/84 (BP Location: Left Arm, Patient Position: Sitting, Cuff Size: Large)   Pulse 80   Temp 98.3 F (36.8 C) (Temporal)   Ht 5' 2.5 (1.588 m)   Wt 181 lb 3.2 oz (82.2 kg)   LMP 04/23/2015 Comment: premenopausal, not preg  SpO2 96%   BMI 32.61 kg/m   BP Readings from Last 3 Encounters:  04/02/24 116/84  08/26/23 120/80  07/01/23 110/86      Physical Exam Constitutional:      General: She is not in acute distress.    Appearance: Normal appearance. She is obese. She is not ill-appearing.  HENT:     Head: Normocephalic.     Right Ear: Tympanic membrane normal.     Left  Ear: Tympanic membrane normal.     Nose: Nose normal.     Mouth/Throat:     Mouth: Mucous membranes are moist.  Eyes:     Extraocular Movements: Extraocular movements intact.     Pupils: Pupils are equal, round, and reactive to light.  Cardiovascular:     Rate and Rhythm: Normal rate and regular rhythm.  Pulmonary:     Effort: Pulmonary effort is normal.     Breath sounds: Normal breath sounds.  Abdominal:     General: Abdomen is flat. Bowel sounds are normal.     Palpations: Abdomen is soft.     Tenderness: There is no guarding or rebound.  Musculoskeletal:     Cervical back: Normal range of motion.     Lumbar back: Positive left straight leg raise test.     Comments: Tenderness along left outer thigh with tightness along IT band    Skin:    General: Skin is warm.     Capillary Refill: Capillary refill takes less than 2 seconds.  Neurological:     General: No focal deficit present.     Mental Status: She is alert.  Psychiatric:        Mood and Affect: Mood normal.        Behavior: Behavior normal.        Thought Content: Thought content normal.        Judgment: Judgment normal.       Results RADIOLOGY Mammogram: Normal (01/2024)  PATHOLOGY Pap smear: Normal (08/17/2021)      Assessment & Plan:   Assessment and Plan Assessment & Plan Adult Wellness Visit Routine wellness visit with no significant changes in family history. Up to date with eye exam, but dental exam is overdue due to loss of previous dentist. No smoking history. Reports occasional sleep disturbances. Recent mammogram in September was normal. Last Pap smear in March 2023 was normal. No current gynecologist, but plans to make an appointment. No advanced directive or living will in place. Discussed pneumonia vaccine, now recommended for age 25 and up, but declined. Discussed Pap smear guidelines, with next due in 2025. - Schedule dental exam at [facility]. - Consider establishing  care with a gynecologist for Pap smears. - Consider obtaining an advanced directive or living will and provide a copy to the clinic. - Recommended pneumonia vaccine, but she declined. - Next Pap smear due in 2025. -Patient Counseling(The following topics were reviewed):  Preventative care handout given to pt  Health maintenance and immunizations reviewed. Please refer to Health maintenance section. Pt advised on safe sex, wearing seatbelts in car, and proper nutrition labwork ordered today  for annual Dental health: Discussed importance of regular tooth brushing, flossing, and dental visits.   Sciatica and intermittent left thigh pain Intermittent left thigh pain, described as tired and heavy, occurring infrequently. No associated low back pain or tingling. Symptoms suggestive of sciatica, with pain radiating from lower buttocks down the leg. Symptoms are infrequent and not associated with significant functional impairment. Discussed potential triggers and management strategies, including stretching, heat application, and use of naproxen  or ibuprofen for inflammation. Discussed potential benefits of massage and use of a massage gun for muscle tightness. Advised on exercises and stretches to alleviate symptoms. - Perform stretching exercises and apply heat to the affected area during flare-ups. - Use naproxen  or ibuprofen for inflammation during flare-ups. - Consider using a muscle relaxer at night if experiencing significant sciatica flare. - Avoid activities that exacerbate symptoms, such as certain exercises. - Consider massage therapy         Follow-up: Return in about 1 year (around 04/02/2025) for f/u CPE.   Ginger Patrick, FNP

## 2024-04-18 ENCOUNTER — Other Ambulatory Visit: Payer: Self-pay | Admitting: Family

## 2024-04-18 DIAGNOSIS — M545 Low back pain, unspecified: Secondary | ICD-10-CM

## 2024-04-22 ENCOUNTER — Encounter: Payer: Self-pay | Admitting: Family

## 2024-04-22 DIAGNOSIS — M545 Low back pain, unspecified: Secondary | ICD-10-CM

## 2024-04-22 DIAGNOSIS — L309 Dermatitis, unspecified: Secondary | ICD-10-CM

## 2024-04-23 MED ORDER — CYCLOBENZAPRINE HCL 5 MG PO TABS: ORAL_TABLET | ORAL | 1 refills | Status: AC

## 2024-04-23 MED ORDER — NAPROXEN 500 MG PO TABS: 500.0000 mg | ORAL_TABLET | Freq: Two times a day (BID) | ORAL | 0 refills | Status: AC

## 2024-04-23 MED ORDER — BETAMETHASONE DIPROPIONATE 0.05 % EX CREA: TOPICAL_CREAM | CUTANEOUS | 1 refills | Status: AC

## 2024-08-24 ENCOUNTER — Ambulatory Visit: Admitting: Internal Medicine

## 2024-08-27 ENCOUNTER — Ambulatory Visit: Admitting: Internal Medicine
